# Patient Record
Sex: Female | Born: 1975 | Race: Black or African American | Hispanic: No | Marital: Married | Smoking: Light tobacco smoker
Health system: Southern US, Community
[De-identification: ages and names within clinical notes are randomized; demographics above are authoritative.]

## PROBLEM LIST (undated history)

## (undated) DIAGNOSIS — D62 Acute posthemorrhagic anemia: Secondary | ICD-10-CM

## (undated) DIAGNOSIS — N921 Excessive and frequent menstruation with irregular cycle: Secondary | ICD-10-CM

## (undated) DIAGNOSIS — Z789 Other specified health status: Secondary | ICD-10-CM

---

## 1997-08-05 ENCOUNTER — Encounter: Admission: RE | Admit: 1997-08-05 | Discharge: 1997-08-05 | Payer: Self-pay | Admitting: Family Medicine

## 1998-02-28 ENCOUNTER — Encounter: Admission: RE | Admit: 1998-02-28 | Discharge: 1998-02-28 | Payer: Self-pay | Admitting: Family Medicine

## 1998-03-28 ENCOUNTER — Encounter: Admission: RE | Admit: 1998-03-28 | Discharge: 1998-03-28 | Payer: Self-pay | Admitting: Family Medicine

## 1998-07-13 ENCOUNTER — Encounter: Admission: RE | Admit: 1998-07-13 | Discharge: 1998-07-13 | Payer: Self-pay | Admitting: Family Medicine

## 1999-07-10 ENCOUNTER — Other Ambulatory Visit: Admission: RE | Admit: 1999-07-10 | Discharge: 1999-07-10 | Payer: Self-pay | Admitting: Family Medicine

## 1999-07-10 ENCOUNTER — Encounter: Admission: RE | Admit: 1999-07-10 | Discharge: 1999-07-10 | Payer: Self-pay | Admitting: Family Medicine

## 2000-09-11 ENCOUNTER — Encounter: Admission: RE | Admit: 2000-09-11 | Discharge: 2000-09-11 | Payer: Self-pay | Admitting: Family Medicine

## 2000-09-11 ENCOUNTER — Other Ambulatory Visit: Admission: RE | Admit: 2000-09-11 | Discharge: 2000-09-11 | Payer: Self-pay | Admitting: *Deleted

## 2001-04-07 ENCOUNTER — Encounter: Admission: RE | Admit: 2001-04-07 | Discharge: 2001-04-07 | Payer: Self-pay | Admitting: Family Medicine

## 2001-08-28 ENCOUNTER — Encounter: Admission: RE | Admit: 2001-08-28 | Discharge: 2001-08-28 | Payer: Self-pay | Admitting: Family Medicine

## 2001-10-01 ENCOUNTER — Encounter: Admission: RE | Admit: 2001-10-01 | Discharge: 2001-10-01 | Payer: Self-pay | Admitting: Family Medicine

## 2001-11-28 ENCOUNTER — Encounter: Admission: RE | Admit: 2001-11-28 | Discharge: 2001-11-28 | Payer: Self-pay | Admitting: Family Medicine

## 2002-12-07 ENCOUNTER — Encounter: Admission: RE | Admit: 2002-12-07 | Discharge: 2002-12-07 | Payer: Self-pay | Admitting: Family Medicine

## 2003-03-02 ENCOUNTER — Encounter: Admission: RE | Admit: 2003-03-02 | Discharge: 2003-03-02 | Payer: Self-pay | Admitting: Sports Medicine

## 2003-03-08 ENCOUNTER — Encounter: Admission: RE | Admit: 2003-03-08 | Discharge: 2003-03-08 | Payer: Self-pay | Admitting: Family Medicine

## 2003-04-28 ENCOUNTER — Encounter: Admission: RE | Admit: 2003-04-28 | Discharge: 2003-04-28 | Payer: Self-pay | Admitting: Family Medicine

## 2003-07-09 ENCOUNTER — Encounter: Admission: RE | Admit: 2003-07-09 | Discharge: 2003-07-09 | Payer: Self-pay | Admitting: Family Medicine

## 2004-04-19 ENCOUNTER — Encounter (INDEPENDENT_AMBULATORY_CARE_PROVIDER_SITE_OTHER): Payer: Self-pay | Admitting: *Deleted

## 2004-04-19 LAB — CONVERTED CEMR LAB

## 2004-05-10 ENCOUNTER — Ambulatory Visit: Payer: Self-pay | Admitting: Sports Medicine

## 2004-05-17 ENCOUNTER — Emergency Department (HOSPITAL_COMMUNITY): Admission: EM | Admit: 2004-05-17 | Discharge: 2004-05-17 | Payer: Self-pay | Admitting: *Deleted

## 2006-05-16 DIAGNOSIS — D179 Benign lipomatous neoplasm, unspecified: Secondary | ICD-10-CM | POA: Insufficient documentation

## 2006-05-16 DIAGNOSIS — E6609 Other obesity due to excess calories: Secondary | ICD-10-CM | POA: Insufficient documentation

## 2006-05-16 DIAGNOSIS — L708 Other acne: Secondary | ICD-10-CM | POA: Insufficient documentation

## 2006-05-16 DIAGNOSIS — F17200 Nicotine dependence, unspecified, uncomplicated: Secondary | ICD-10-CM | POA: Insufficient documentation

## 2006-05-16 DIAGNOSIS — E66811 Obesity, class 1: Secondary | ICD-10-CM | POA: Insufficient documentation

## 2006-05-16 DIAGNOSIS — E669 Obesity, unspecified: Secondary | ICD-10-CM | POA: Insufficient documentation

## 2006-05-17 ENCOUNTER — Encounter (INDEPENDENT_AMBULATORY_CARE_PROVIDER_SITE_OTHER): Payer: Self-pay | Admitting: *Deleted

## 2008-12-03 ENCOUNTER — Ambulatory Visit (HOSPITAL_COMMUNITY): Admission: RE | Admit: 2008-12-03 | Discharge: 2008-12-03 | Payer: Self-pay | Admitting: Obstetrics

## 2009-01-14 ENCOUNTER — Emergency Department (HOSPITAL_COMMUNITY): Admission: EM | Admit: 2009-01-14 | Discharge: 2009-01-14 | Payer: Self-pay | Admitting: Emergency Medicine

## 2009-02-04 ENCOUNTER — Encounter: Admission: RE | Admit: 2009-02-04 | Discharge: 2009-02-04 | Payer: Self-pay | Admitting: Internal Medicine

## 2009-07-27 ENCOUNTER — Emergency Department (HOSPITAL_BASED_OUTPATIENT_CLINIC_OR_DEPARTMENT_OTHER): Admission: EM | Admit: 2009-07-27 | Discharge: 2009-07-27 | Payer: Self-pay | Admitting: Emergency Medicine

## 2009-07-27 ENCOUNTER — Ambulatory Visit: Payer: Self-pay | Admitting: Diagnostic Radiology

## 2009-08-08 ENCOUNTER — Encounter: Admission: RE | Admit: 2009-08-08 | Discharge: 2009-08-08 | Payer: Self-pay | Admitting: Internal Medicine

## 2009-12-12 ENCOUNTER — Inpatient Hospital Stay (HOSPITAL_COMMUNITY): Admission: AD | Admit: 2009-12-12 | Discharge: 2009-12-12 | Payer: Self-pay | Admitting: Obstetrics

## 2009-12-12 DIAGNOSIS — N946 Dysmenorrhea, unspecified: Secondary | ICD-10-CM

## 2009-12-12 DIAGNOSIS — D259 Leiomyoma of uterus, unspecified: Secondary | ICD-10-CM

## 2010-01-14 ENCOUNTER — Emergency Department (HOSPITAL_COMMUNITY): Admission: EM | Admit: 2010-01-14 | Discharge: 2010-01-14 | Payer: Self-pay | Admitting: Emergency Medicine

## 2010-05-23 ENCOUNTER — Emergency Department (HOSPITAL_COMMUNITY)
Admission: EM | Admit: 2010-05-23 | Discharge: 2010-05-24 | Disposition: A | Discharge status: Home / Self Care | Payer: 59 | Attending: Emergency Medicine | Admitting: Emergency Medicine

## 2010-05-23 DIAGNOSIS — R21 Rash and other nonspecific skin eruption: Secondary | ICD-10-CM | POA: Insufficient documentation

## 2010-05-23 DIAGNOSIS — J329 Chronic sinusitis, unspecified: Secondary | ICD-10-CM | POA: Insufficient documentation

## 2010-05-24 ENCOUNTER — Other Ambulatory Visit: Payer: Self-pay | Admitting: Family Medicine

## 2010-05-24 DIAGNOSIS — R609 Edema, unspecified: Secondary | ICD-10-CM

## 2010-05-25 ENCOUNTER — Ambulatory Visit
Admission: RE | Admit: 2010-05-25 | Discharge: 2010-05-25 | Disposition: A | Discharge status: Home / Self Care | Payer: Managed Care, Other (non HMO) | Source: Ambulatory Visit | Attending: Family Medicine | Admitting: Family Medicine

## 2010-05-25 ENCOUNTER — Other Ambulatory Visit: Payer: Managed Care, Other (non HMO)

## 2010-05-25 DIAGNOSIS — R609 Edema, unspecified: Secondary | ICD-10-CM

## 2010-05-31 LAB — POCT I-STAT, CHEM 8
BUN: 9 mg/dL (ref 6–23)
Calcium, Ion: 1.2 mmol/L (ref 1.12–1.32)
Chloride: 109 mEq/L (ref 96–112)
Creatinine, Ser: 0.7 mg/dL (ref 0.4–1.2)
Glucose, Bld: 88 mg/dL (ref 70–99)
HCT: 38 % (ref 36.0–46.0)
Hemoglobin: 12.9 g/dL (ref 12.0–15.0)
Potassium: 3.8 mEq/L (ref 3.5–5.1)
Sodium: 141 mEq/L (ref 135–145)
TCO2: 23 mmol/L (ref 0–100)

## 2010-05-31 LAB — PREGNANCY, URINE: Preg Test, Ur: NEGATIVE

## 2010-06-01 LAB — URINALYSIS, ROUTINE W REFLEX MICROSCOPIC
Bilirubin Urine: NEGATIVE
Glucose, UA: NEGATIVE mg/dL
Ketones, ur: NEGATIVE mg/dL
Nitrite: NEGATIVE
Protein, ur: NEGATIVE mg/dL
Specific Gravity, Urine: 1.02 (ref 1.005–1.030)
Urobilinogen, UA: 0.2 mg/dL (ref 0.0–1.0)
pH: 6 (ref 5.0–8.0)

## 2010-06-01 LAB — CBC
HCT: 34 % — ABNORMAL LOW (ref 36.0–46.0)
Hemoglobin: 11.8 g/dL — ABNORMAL LOW (ref 12.0–15.0)
MCH: 35.2 pg — ABNORMAL HIGH (ref 26.0–34.0)
MCHC: 34.6 g/dL (ref 30.0–36.0)
MCV: 101.7 fL — ABNORMAL HIGH (ref 78.0–100.0)
Platelets: 265 10*3/uL (ref 150–400)
RBC: 3.35 MIL/uL — ABNORMAL LOW (ref 3.87–5.11)
RDW: 14.1 % (ref 11.5–15.5)
WBC: 6.6 10*3/uL (ref 4.0–10.5)

## 2010-06-01 LAB — POCT PREGNANCY, URINE: Preg Test, Ur: NEGATIVE

## 2010-06-01 LAB — URINE MICROSCOPIC-ADD ON

## 2010-06-06 LAB — DIFFERENTIAL
Basophils Absolute: 0.1 10*3/uL (ref 0.0–0.1)
Basophils Relative: 1 % (ref 0–1)
Eosinophils Absolute: 0.2 10*3/uL (ref 0.0–0.7)
Eosinophils Relative: 3 % (ref 0–5)
Lymphocytes Relative: 37 % (ref 12–46)
Lymphs Abs: 3.1 10*3/uL (ref 0.7–4.0)
Monocytes Absolute: 0.8 10*3/uL (ref 0.1–1.0)
Monocytes Relative: 9 % (ref 3–12)
Neutro Abs: 4.4 10*3/uL (ref 1.7–7.7)
Neutrophils Relative %: 52 % (ref 43–77)

## 2010-06-06 LAB — COMPREHENSIVE METABOLIC PANEL
ALT: 20 U/L (ref 0–35)
AST: 22 U/L (ref 0–37)
Albumin: 4.2 g/dL (ref 3.5–5.2)
Alkaline Phosphatase: 51 U/L (ref 39–117)
BUN: 10 mg/dL (ref 6–23)
CO2: 21 mEq/L (ref 19–32)
Calcium: 9.7 mg/dL (ref 8.4–10.5)
Chloride: 109 mEq/L (ref 96–112)
Creatinine, Ser: 0.6 mg/dL (ref 0.4–1.2)
GFR calc Af Amer: >60 mL/min (ref >60)
GFR calc non Af Amer: >60 mL/min (ref >60)
Glucose, Bld: 75 mg/dL (ref 70–99)
Potassium: 4.3 mEq/L (ref 3.5–5.1)
Sodium: 143 mEq/L (ref 135–145)
Total Bilirubin: 0.4 mg/dL (ref 0.3–1.2)
Total Protein: 7.7 g/dL (ref 6.0–8.3)

## 2010-06-06 LAB — CBC
HCT: 40.3 % (ref 36.0–46.0)
Hemoglobin: 13.9 g/dL (ref 12.0–15.0)
MCHC: 34.6 g/dL (ref 30.0–36.0)
MCV: 100.1 fL — ABNORMAL HIGH (ref 78.0–100.0)
Platelets: 263 10*3/uL (ref 150–400)
RBC: 4.02 MIL/uL (ref 3.87–5.11)
RDW: 12.6 % (ref 11.5–15.5)
WBC: 8.6 10*3/uL (ref 4.0–10.5)

## 2010-06-06 LAB — D-DIMER, QUANTITATIVE: D-Dimer, Quant: 0.66 ug/mL-FEU — ABNORMAL HIGH (ref 0.00–0.48)

## 2010-06-06 LAB — POCT CARDIAC MARKERS
CKMB, poc: <1 ng/mL — ABNORMAL LOW (ref 1.0–8.0)
Myoglobin, poc: 23.3 ng/mL (ref 12–200)
Troponin i, poc: <0.05 ng/mL (ref 0.00–0.09)

## 2010-06-06 LAB — LIPASE, BLOOD: Lipase: 74 U/L (ref 23–300)

## 2010-06-10 ENCOUNTER — Emergency Department (HOSPITAL_COMMUNITY)
Admission: EM | Admit: 2010-06-10 | Discharge: 2010-06-10 | Disposition: A | Discharge status: Home / Self Care | Payer: 59 | Attending: Emergency Medicine | Admitting: Emergency Medicine

## 2010-06-10 ENCOUNTER — Emergency Department (HOSPITAL_COMMUNITY): Payer: 59

## 2010-06-10 DIAGNOSIS — R072 Precordial pain: Secondary | ICD-10-CM | POA: Insufficient documentation

## 2010-06-10 DIAGNOSIS — R0602 Shortness of breath: Secondary | ICD-10-CM | POA: Insufficient documentation

## 2010-06-10 LAB — CBC
MCV: 98.4 fL (ref 78.0–100.0)
Platelets: 281 10*3/uL (ref 150–400)
RBC: 3.7 MIL/uL — ABNORMAL LOW (ref 3.87–5.11)
RDW: 13.6 % (ref 11.5–15.5)
WBC: 8.5 10*3/uL (ref 4.0–10.5)

## 2010-06-10 LAB — POCT CARDIAC MARKERS
CKMB, poc: <1 ng/mL — ABNORMAL LOW (ref 1.0–8.0)
CKMB, poc: <1 ng/mL — ABNORMAL LOW (ref 1.0–8.0)
Myoglobin, poc: 32.8 ng/mL (ref 12–200)

## 2010-06-10 LAB — POCT I-STAT, CHEM 8
BUN: 5 mg/dL — ABNORMAL LOW (ref 6–23)
Calcium, Ion: 1.18 mmol/L (ref 1.12–1.32)
Chloride: 106 mEq/L (ref 96–112)
Creatinine, Ser: 0.8 mg/dL (ref 0.4–1.2)
Glucose, Bld: 93 mg/dL (ref 70–99)
HCT: 39 % (ref 36.0–46.0)
Hemoglobin: 13.3 g/dL (ref 12.0–15.0)
Potassium: 3.9 mEq/L (ref 3.5–5.1)
Sodium: 140 mEq/L (ref 135–145)
TCO2: 23 mmol/L (ref 0–100)

## 2010-06-10 LAB — DIFFERENTIAL
Basophils Relative: 0 % (ref 0–1)
Eosinophils Absolute: 0.1 10*3/uL (ref 0.0–0.7)
Eosinophils Relative: 2 % (ref 0–5)
Lymphs Abs: 1.9 10*3/uL (ref 0.7–4.0)
Neutrophils Relative %: 69 % (ref 43–77)

## 2010-06-22 LAB — GC/CHLAMYDIA PROBE AMP, GENITAL: Chlamydia, DNA Probe: NEGATIVE

## 2010-06-22 LAB — URINALYSIS, ROUTINE W REFLEX MICROSCOPIC
Glucose, UA: NEGATIVE mg/dL
Protein, ur: 30 mg/dL — AB
Specific Gravity, Urine: 1.027 (ref 1.005–1.030)
Urobilinogen, UA: 1 mg/dL (ref 0.0–1.0)

## 2010-06-22 LAB — URINE MICROSCOPIC-ADD ON

## 2010-06-22 LAB — WET PREP, GENITAL
Clue Cells Wet Prep HPF POC: NONE SEEN
WBC, Wet Prep HPF POC: NONE SEEN

## 2010-06-22 LAB — POCT PREGNANCY, URINE: Preg Test, Ur: NEGATIVE

## 2010-08-31 ENCOUNTER — Other Ambulatory Visit (HOSPITAL_COMMUNITY): Payer: Self-pay | Admitting: Obstetrics and Gynecology

## 2010-08-31 DIAGNOSIS — N979 Female infertility, unspecified: Secondary | ICD-10-CM

## 2010-09-01 ENCOUNTER — Ambulatory Visit (HOSPITAL_COMMUNITY): Payer: 59

## 2010-09-24 ENCOUNTER — Emergency Department (HOSPITAL_COMMUNITY): Payer: 59

## 2010-09-24 ENCOUNTER — Observation Stay (HOSPITAL_COMMUNITY)
Admission: EM | Admit: 2010-09-24 | Discharge: 2010-09-25 | Disposition: A | Discharge status: Home / Self Care | Payer: 59 | Source: Ambulatory Visit | Attending: Emergency Medicine | Admitting: Emergency Medicine

## 2010-09-24 DIAGNOSIS — R079 Chest pain, unspecified: Principal | ICD-10-CM | POA: Insufficient documentation

## 2010-09-24 LAB — POCT I-STAT, CHEM 8
BUN: 4 mg/dL — ABNORMAL LOW (ref 6–23)
Chloride: 104 mEq/L (ref 96–112)
Creatinine, Ser: 0.8 mg/dL (ref 0.50–1.10)
Hemoglobin: 13.3 g/dL (ref 12.0–15.0)
Potassium: 3.8 mEq/L (ref 3.5–5.1)
Sodium: 143 mEq/L (ref 135–145)

## 2010-09-24 LAB — POCT PREGNANCY, URINE: Preg Test, Ur: NEGATIVE

## 2010-09-24 LAB — CK TOTAL AND CKMB (NOT AT ARMC)
CK, MB: 1.1 ng/mL (ref 0.3–4.0)
Relative Index: INVALID (ref 0.0–2.5)

## 2010-09-24 LAB — TROPONIN I
Troponin I: <0.3 ng/mL (ref <0.30)
Troponin I: <0.3 ng/mL (ref <0.30)

## 2010-09-24 LAB — URINALYSIS, ROUTINE W REFLEX MICROSCOPIC
Bilirubin Urine: NEGATIVE
Ketones, ur: NEGATIVE mg/dL
Protein, ur: NEGATIVE mg/dL
Urobilinogen, UA: 0.2 mg/dL (ref 0.0–1.0)

## 2010-09-25 DIAGNOSIS — R072 Precordial pain: Secondary | ICD-10-CM

## 2010-09-25 LAB — TROPONIN I: Troponin I: <0.3 ng/mL (ref <0.30)

## 2011-04-05 ENCOUNTER — Ambulatory Visit (INDEPENDENT_AMBULATORY_CARE_PROVIDER_SITE_OTHER): Payer: 59 | Admitting: Family Medicine

## 2011-04-05 DIAGNOSIS — K219 Gastro-esophageal reflux disease without esophagitis: Secondary | ICD-10-CM

## 2011-05-28 ENCOUNTER — Ambulatory Visit: Payer: 59

## 2011-10-09 ENCOUNTER — Telehealth: Payer: Self-pay

## 2011-10-09 NOTE — Telephone Encounter (Signed)
Pharmacy LM on nurse VM stating that the pt needs a prior auth for her Nexium. [employer] and completed PA on telephone. Pt reported that she has tried/failed both omeprazole and protonix (preferred) in past. PA was approved 09/18/11-10/08/12 case #[id number]. Called [facility] to notify them of approval.

## 2011-10-12 ENCOUNTER — Emergency Department (HOSPITAL_COMMUNITY): Payer: 59

## 2011-10-12 ENCOUNTER — Encounter (HOSPITAL_COMMUNITY): Payer: Self-pay | Admitting: *Deleted

## 2011-10-12 ENCOUNTER — Emergency Department (HOSPITAL_COMMUNITY)
Admission: EM | Admit: 2011-10-12 | Discharge: 2011-10-12 | Disposition: A | Discharge status: Home / Self Care | Payer: 59 | Attending: Emergency Medicine | Admitting: Emergency Medicine

## 2011-10-12 DIAGNOSIS — F17200 Nicotine dependence, unspecified, uncomplicated: Secondary | ICD-10-CM | POA: Insufficient documentation

## 2011-10-12 DIAGNOSIS — R220 Localized swelling, mass and lump, head: Secondary | ICD-10-CM | POA: Insufficient documentation

## 2011-10-12 NOTE — ED Notes (Signed)
Pt reports left side of face swollen and pain to jaw. Saw PCP on Sunday, given prednisone and antibiotic. States face more swollen today.

## 2011-10-12 NOTE — ED Notes (Signed)
Pt states "last Friday night I was eating some fish and started having sharp pains in my jaw, you can see it's swollen, I went to the [facility] but they didn't find anything, they did an amylase test that was negative, my doctor can't see me until next week but I wanted to know what was going on"; pt presents with edema to left lower jaw, no cavities noted to left lower back teeth.

## 2011-10-12 NOTE — ED Provider Notes (Signed)
History     CSN: [id number]  Arrival date & time 10/12/11  1300   First MD Initiated Contact with Patient 10/12/11 1425      Chief Complaint  Patient presents with  . Jaw Pain    (Consider location/radiation/quality/duration/timing/severity/associated sxs/prior treatment) HPI Comments: Patient with L cheek swelling x 1 week. She was eating fish at time of onset. No prior known food allergies. No pain. She [facility] when symptoms persisted. Was prescribed prednisone and keflex, had blood test checked which were negative. She has not been improving with treatment. Onset acute. Course is constant, not changing. Ice applied at home without relief. Nothing makes symptoms better or worse. No SOB, trouble breathing. No new medications.   The history is provided by the patient.    History reviewed. No pertinent past medical history.  History reviewed. No pertinent past surgical history.  No family history on file.  History  Substance Use Topics  . Smoking status: Current Some Day Smoker  . Smokeless tobacco: Not on file  . Alcohol Use: No    OB History    Grav Para Term Preterm Abortions TAB SAB Ect Mult Living                  Review of Systems  Constitutional: Negative for fever.  HENT: Positive for facial swelling. Negative for trouble swallowing and dental problem.   Eyes: Negative for redness.  Respiratory: Negative for shortness of breath, wheezing and stridor.   Cardiovascular: Negative for chest pain.  Gastrointestinal: Negative for nausea and vomiting.  Musculoskeletal: Negative for myalgias.  Skin: Negative for rash.  Neurological: Negative for light-headedness.  Psychiatric/Behavioral: Negative for confusion.    Allergies  Penicillins  Home Medications   Current Outpatient Rx  Name Route Sig Dispense Refill  . CEPHALEXIN 500 MG PO CAPS Oral Take 500 mg by mouth 2 (two) times daily.    . ADULT MULTIVITAMIN W/MINERALS CH Oral Take 1 tablet by mouth  daily.    Marland Kitchen PREDNISONE 20 MG PO TABS Oral Take 10-40 mg by mouth See admin instructions. Take 2 tablets for 2 days, 1 tablet for 2 days, half tablet for 2 days      BP 135/82  Pulse 88  Temp 98.8 F (37.1 C) (Oral)  Resp 18  SpO2 100%  Physical Exam  Nursing note and vitals reviewed. Constitutional: She appears well-developed and well-nourished.  HENT:  Head: Normocephalic and atraumatic.  Right Ear: External ear normal.  Left Ear: External ear normal.  Nose: Nose normal.       Subtle swelling to L cheek without abscess, erythema, or warmth. Full ROM in jaw. Area is not tender to palp. No dental infections seen.   Eyes: Conjunctivae are normal.  Neck: Normal range of motion. Neck supple.  Pulmonary/Chest: No respiratory distress.  Lymphadenopathy:    She has no cervical adenopathy.  Neurological: She is alert.  Skin: Skin is warm and dry.  Psychiatric: She has a normal mood and affect.    ED Course  Procedures (including critical care time)  Labs Reviewed - No data to display Dg Orthopantogram  10/12/2011  *RADIOLOGY REPORT*  Clinical Data: Left side mandible swelling for 1 week.  ORTHOPANTOGRAM/PANORAMIC  Comparison: None.  Findings: No bony destructive change or fracture is identified.  No periapical lucency is seen.  Mandibular condyles are located.  IMPRESSION: No acute finding.  Original Report Authenticated By: Bernadene Bell. D'ALESSIO, M.D.     1. Left facial  swelling     2:58 PM Patient seen and examined. Will check Pan[employer].   Vital signs reviewed and are as follows: Filed Vitals:   10/12/11 1304  BP: 135/82  Pulse: 88  Temp: 98.8 F (37.1 C)  Resp: 18   X-ray neg. Discussed case with Dr. Juleen China. No further testing/treatment warranted. Patient informed of findings. She was urged to watch area, complete medications. If worsening -- return to ED. Otherwise follow-up with PCP which she plans to do in 1 week. She verbalizes understanding and agrees with plan.     MDM  Mild facial swelling. No infection seen. No sialolith suspected. Not responding to prednisone. PCP follow-up indicated. No tongue, neck, or throat involvement.         [place], [place] 10/13/11 [phone]

## 2011-10-13 NOTE — ED Provider Notes (Signed)
.    Donnetta Hutching, MD 10/14/11 [phone]

## 2012-04-23 ENCOUNTER — Telehealth: Payer: Self-pay | Admitting: *Deleted

## 2012-04-23 MED ORDER — ESOMEPRAZOLE MAGNESIUM 40 MG PO CPDR: 40.0000 mg | DELAYED_RELEASE_CAPSULE | Freq: Every day | ORAL | Status: DC

## 2012-04-23 NOTE — Telephone Encounter (Signed)
Refill done. Needs OV.

## 2012-04-23 NOTE — Telephone Encounter (Signed)
[facility] requesting refill on nexium 40mg .  Last fill on 03/20/12.  Chart is at nurses station for review [place] [id number]

## 2012-04-23 NOTE — Telephone Encounter (Signed)
Pt notified that rx was sent in and that she needs an ov

## 2012-04-25 ENCOUNTER — Emergency Department (HOSPITAL_COMMUNITY)
Admission: EM | Admit: 2012-04-25 | Discharge: 2012-04-25 | Disposition: A | Discharge status: Home / Self Care | Payer: No Typology Code available for payment source | Attending: Emergency Medicine | Admitting: Emergency Medicine

## 2012-04-25 DIAGNOSIS — S298XXA Other specified injuries of thorax, initial encounter: Secondary | ICD-10-CM | POA: Insufficient documentation

## 2012-04-25 DIAGNOSIS — IMO0002 Reserved for concepts with insufficient information to code with codable children: Secondary | ICD-10-CM | POA: Insufficient documentation

## 2012-04-25 DIAGNOSIS — F17200 Nicotine dependence, unspecified, uncomplicated: Secondary | ICD-10-CM | POA: Insufficient documentation

## 2012-04-25 DIAGNOSIS — Y92410 Unspecified street and highway as the place of occurrence of the external cause: Secondary | ICD-10-CM | POA: Insufficient documentation

## 2012-04-25 DIAGNOSIS — S139XXA Sprain of joints and ligaments of unspecified parts of neck, initial encounter: Secondary | ICD-10-CM | POA: Insufficient documentation

## 2012-04-25 DIAGNOSIS — Y9389 Activity, other specified: Secondary | ICD-10-CM | POA: Insufficient documentation

## 2012-04-25 MED ORDER — IBUPROFEN 800 MG PO TABS: 800.0000 mg | ORAL_TABLET | Freq: Three times a day (TID) | ORAL | Status: DC

## 2012-04-25 MED ORDER — OXYCODONE-ACETAMINOPHEN 5-325 MG PO TABS
2.0000 | ORAL_TABLET | Freq: Once | ORAL | Status: AC
  Administered 2012-04-25: 2 via ORAL
  Filled 2012-04-25: qty 2

## 2012-04-25 MED ORDER — CYCLOBENZAPRINE HCL 10 MG PO TABS
10.0000 mg | ORAL_TABLET | Freq: Once | ORAL | Status: AC
  Administered 2012-04-25: 10 mg via ORAL
  Filled 2012-04-25: qty 1

## 2012-04-25 MED ORDER — HYDROCODONE-ACETAMINOPHEN 5-325 MG PO TABS: 1.0000 | ORAL_TABLET | Freq: Four times a day (QID) | ORAL | Status: DC | PRN

## 2012-04-25 MED ORDER — IBUPROFEN 800 MG PO TABS
800.0000 mg | ORAL_TABLET | Freq: Once | ORAL | Status: AC
  Administered 2012-04-25: 800 mg via ORAL
  Filled 2012-04-25: qty 1

## 2012-04-25 MED ORDER — CYCLOBENZAPRINE HCL 10 MG PO TABS: 10.0000 mg | ORAL_TABLET | Freq: Two times a day (BID) | ORAL | Status: DC | PRN
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
## 2012-04-25 NOTE — ED Provider Notes (Signed)
Medical screening examination/treatment/procedure(s) were performed by non-physician practitioner and as supervising physician I was immediately available for consultation/collaboration.   Celene Kras, MD 04/25/12 2040
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
## 2012-06-09 ENCOUNTER — Ambulatory Visit: Payer: No Typology Code available for payment source

## 2012-06-11 ENCOUNTER — Ambulatory Visit: Payer: No Typology Code available for payment source | Admitting: Physical Therapy

## 2012-06-19 ENCOUNTER — Ambulatory Visit: Payer: Managed Care, Other (non HMO) | Attending: Internal Medicine | Admitting: Physical Therapy

## 2012-06-19 DIAGNOSIS — M545 Low back pain, unspecified: Secondary | ICD-10-CM | POA: Insufficient documentation

## 2012-06-19 DIAGNOSIS — IMO0001 Reserved for inherently not codable concepts without codable children: Secondary | ICD-10-CM | POA: Insufficient documentation

## 2012-06-19 DIAGNOSIS — M542 Cervicalgia: Secondary | ICD-10-CM | POA: Insufficient documentation

## 2012-06-20 ENCOUNTER — Ambulatory Visit: Payer: Managed Care, Other (non HMO) | Admitting: Physical Therapy

## 2012-06-24 ENCOUNTER — Ambulatory Visit: Payer: Managed Care, Other (non HMO)

## 2012-06-26 ENCOUNTER — Ambulatory Visit: Payer: Managed Care, Other (non HMO) | Admitting: Physical Therapy

## 2012-07-01 ENCOUNTER — Ambulatory Visit: Payer: Managed Care, Other (non HMO) | Admitting: Physical Therapy

## 2012-07-03 ENCOUNTER — Ambulatory Visit: Payer: Managed Care, Other (non HMO) | Admitting: Physical Therapy

## 2012-07-08 ENCOUNTER — Ambulatory Visit: Payer: Managed Care, Other (non HMO) | Admitting: Physical Therapy

## 2012-07-10 ENCOUNTER — Ambulatory Visit: Payer: Managed Care, Other (non HMO) | Admitting: Physical Therapy

## 2012-07-15 ENCOUNTER — Ambulatory Visit: Payer: Managed Care, Other (non HMO) | Admitting: Physical Therapy

## 2012-07-17 ENCOUNTER — Ambulatory Visit: Payer: Managed Care, Other (non HMO) | Attending: Internal Medicine | Admitting: Physical Therapy

## 2012-07-17 DIAGNOSIS — IMO0001 Reserved for inherently not codable concepts without codable children: Secondary | ICD-10-CM | POA: Insufficient documentation

## 2012-07-17 DIAGNOSIS — M545 Low back pain, unspecified: Secondary | ICD-10-CM | POA: Insufficient documentation

## 2012-07-17 DIAGNOSIS — M542 Cervicalgia: Secondary | ICD-10-CM | POA: Insufficient documentation

## 2012-07-22 ENCOUNTER — Ambulatory Visit: Payer: Managed Care, Other (non HMO) | Admitting: Physical Therapy

## 2012-07-24 ENCOUNTER — Ambulatory Visit: Payer: Managed Care, Other (non HMO) | Admitting: Physical Therapy

## 2012-07-29 ENCOUNTER — Ambulatory Visit: Payer: Managed Care, Other (non HMO) | Admitting: Physical Therapy

## 2012-07-31 ENCOUNTER — Ambulatory Visit: Payer: Managed Care, Other (non HMO) | Admitting: Physical Therapy

## 2012-08-05 ENCOUNTER — Ambulatory Visit: Payer: Managed Care, Other (non HMO) | Admitting: Physical Therapy

## 2012-08-07 ENCOUNTER — Ambulatory Visit: Payer: Managed Care, Other (non HMO) | Admitting: Physical Therapy

## 2012-08-13 ENCOUNTER — Encounter: Payer: Managed Care, Other (non HMO) | Admitting: Physical Therapy

## 2012-08-13 ENCOUNTER — Ambulatory Visit: Payer: Managed Care, Other (non HMO) | Admitting: Physical Therapy

## 2012-08-14 ENCOUNTER — Ambulatory Visit: Payer: Managed Care, Other (non HMO) | Admitting: Physical Therapy

## 2012-08-15 ENCOUNTER — Encounter: Payer: Managed Care, Other (non HMO) | Admitting: Physical Therapy

## 2012-08-19 ENCOUNTER — Ambulatory Visit: Payer: Managed Care, Other (non HMO) | Attending: Internal Medicine | Admitting: Physical Therapy

## 2012-08-19 DIAGNOSIS — M542 Cervicalgia: Secondary | ICD-10-CM | POA: Insufficient documentation

## 2012-08-19 DIAGNOSIS — M545 Low back pain, unspecified: Secondary | ICD-10-CM | POA: Insufficient documentation

## 2012-08-19 DIAGNOSIS — IMO0001 Reserved for inherently not codable concepts without codable children: Secondary | ICD-10-CM | POA: Insufficient documentation

## 2013-01-03 ENCOUNTER — Observation Stay (HOSPITAL_COMMUNITY)
Admission: AD | Admit: 2013-01-03 | Discharge: 2013-01-04 | Disposition: A | Discharge status: Home / Self Care | Payer: Managed Care, Other (non HMO) | Source: Ambulatory Visit | Attending: Obstetrics and Gynecology | Admitting: Obstetrics and Gynecology

## 2013-01-03 ENCOUNTER — Encounter (HOSPITAL_COMMUNITY): Payer: Self-pay | Admitting: Family

## 2013-01-03 DIAGNOSIS — N921 Excessive and frequent menstruation with irregular cycle: Secondary | ICD-10-CM

## 2013-01-03 DIAGNOSIS — D62 Acute posthemorrhagic anemia: Secondary | ICD-10-CM | POA: Diagnosis present

## 2013-01-03 DIAGNOSIS — N920 Excessive and frequent menstruation with regular cycle: Principal | ICD-10-CM | POA: Insufficient documentation

## 2013-01-03 HISTORY — DX: Other specified health status: Z78.9

## 2013-01-03 HISTORY — DX: Acute posthemorrhagic anemia: D62

## 2013-01-03 HISTORY — DX: Excessive and frequent menstruation with irregular cycle: N92.1

## 2013-01-03 LAB — URINALYSIS, ROUTINE W REFLEX MICROSCOPIC
Nitrite: NEGATIVE
Specific Gravity, Urine: 1.01 (ref 1.005–1.030)
Urobilinogen, UA: 0.2 mg/dL (ref 0.0–1.0)
pH: 7.5 (ref 5.0–8.0)

## 2013-01-03 LAB — CBC
MCH: 34.2 pg — ABNORMAL HIGH (ref 26.0–34.0)
MCHC: 35.1 g/dL (ref 30.0–36.0)
Platelets: 235 10*3/uL (ref 150–400)
RBC: 1.9 MIL/uL — ABNORMAL LOW (ref 3.87–5.11)

## 2013-01-03 LAB — URINE MICROSCOPIC-ADD ON

## 2013-01-03 LAB — POCT PREGNANCY, URINE: Preg Test, Ur: NEGATIVE

## 2013-01-03 MED ORDER — DIPHENHYDRAMINE HCL 25 MG PO CAPS
25.0000 mg | ORAL_CAPSULE | Freq: Once | ORAL | Status: AC
  Administered 2013-01-03: 25 mg via ORAL
  Filled 2013-01-03: qty 1

## 2013-01-03 MED ORDER — ESTROGENS CONJUGATED 25 MG IJ SOLR
25.0000 mg | Freq: Four times a day (QID) | INTRAMUSCULAR | Status: DC | PRN
  Administered 2013-01-03 – 2013-01-04 (×3): 25 mg via INTRAVENOUS
  Filled 2013-01-03 (×3): qty 25

## 2013-01-03 MED ORDER — SODIUM CHLORIDE 0.9 % IV SOLN
500.0000 mL | Freq: Once | INTRAVENOUS | Status: AC
  Administered 2013-01-03: 500 mL via INTRAVENOUS

## 2013-01-03 MED ORDER — SODIUM CHLORIDE 0.9 % IV SOLN
INTRAVENOUS | Status: DC
  Administered 2013-01-03: 500 mL via INTRAVENOUS

## 2013-01-03 MED ORDER — IBUPROFEN 600 MG PO TABS
600.0000 mg | ORAL_TABLET | Freq: Four times a day (QID) | ORAL | Status: DC | PRN
  Administered 2013-01-03 – 2013-01-04 (×2): 600 mg via ORAL
  Filled 2013-01-03 (×2): qty 1

## 2013-01-03 MED ORDER — SODIUM CHLORIDE 0.9 % IV SOLN: INTRAVENOUS | Status: DC

## 2013-01-03 MED ORDER — ACETAMINOPHEN 325 MG PO TABS
650.0000 mg | ORAL_TABLET | Freq: Once | ORAL | Status: AC
  Administered 2013-01-03: 650 mg via ORAL
  Filled 2013-01-03: qty 2

## 2013-01-03 NOTE — MAU Note (Addendum)
37 yo, G3P1, presents to MAU with c/o vaginal bleeding. Reports LMP 12/23/12 with normal bleeding until 10/10. Reports she then began bleeding and passing clots on 10/15 and continues to bleed today. Reports large clots; saturating super pad within an hour since Wednesday. Reports HA, dizziness with standing, and feeling sleepy. Denies pain. Denies use of any medications. No contraception method at this time. Stopped smoking 1 week ago.

## 2013-01-03 NOTE — MAU Provider Note (Signed)
History     CSN: [id number]  Arrival date and time: 01/03/13 1202 Provider notified @: 1241 Provider on unit: 1335 Provider at bedside: 1340     Chief Complaint  Patient presents with  . Vaginal Bleeding   HPI  Shelby Case is 37 y.o. G3P1 female presenting with bright red, heavy vaginal bleeding.  She reports her LMP  Was 12/23/2012 and stopped 4-5 days later.  She reports that her menstrual cycle was a normal length and flow.  She started bleeding  Heavily on Wednesday 12/31/2012.  She is symptomatic today with c/o extreme fatigue and light-headedness.  She denies any significant medical history.  Past Medical History  Diagnosis Date  . Medical history non-contributory     History reviewed. No pertinent past surgical history.  History reviewed. No pertinent family history.  History  Substance Use Topics  . Smoking status: Former Smoker    Quit date: 12/27/2012  . Smokeless tobacco: Not on file  . Alcohol Use: No    Allergies:  Allergies  Allergen Reactions  . Penicillins Other (See Comments)    Childhood reaction     Prescriptions prior to admission  Medication Sig Dispense Refill  . cyclobenzaprine (FLEXERIL) 10 MG tablet Take 1 tablet (10 mg total) by mouth 2 (two) times daily as needed for muscle spasms.  20 tablet  0  . esomeprazole (NEXIUM) 40 MG capsule Take 1 capsule (40 mg total) by mouth daily.  30 capsule  0  . HYDROcodone-acetaminophen (NORCO/VICODIN) 5-325 MG per tablet Take 1-2 tablets by mouth every 6 (six) hours as needed for pain.  6 tablet  0  . ibuprofen (ADVIL,MOTRIN) 200 MG tablet Take 800 mg by mouth every 6 (six) hours as needed. cramps      . ibuprofen (ADVIL,MOTRIN) 800 MG tablet Take 1 tablet (800 mg total) by mouth 3 (three) times daily.  21 tablet  0  . Multiple Vitamin (MULTIVITAMIN WITH MINERALS) TABS Take 1 tablet by mouth daily.        Review of Systems  Constitutional: Positive for malaise/fatigue.  Eyes: Negative.    Respiratory: Negative.   Cardiovascular: Negative.   Gastrointestinal: Negative.   Genitourinary: Negative.   Musculoskeletal: Negative.   Skin: Negative.   Neurological: Positive for dizziness and headaches.  Endo/Heme/Allergies: Negative.   Psychiatric/Behavioral: Negative.    Physical Exam   Blood pressure 125/68, pulse 101, temperature 98.7 F (37.1 C), temperature source Oral, resp. rate 16. Results for orders placed during the hospital encounter of 01/03/13 (from the past 24 hour(s))  URINALYSIS, ROUTINE W REFLEX MICROSCOPIC     Status: Abnormal   Collection Time    01/03/13 12:15 PM      Result Value Range   Color, Urine AMBER (*) YELLOW   APPearance CLOUDY (*) CLEAR   Specific Gravity, Urine 1.010  1.005 - 1.030   pH 7.5  5.0 - 8.0   Glucose, UA NEGATIVE  NEGATIVE mg/dL   Hgb urine dipstick LARGE (*) NEGATIVE   Bilirubin Urine NEGATIVE  NEGATIVE   Ketones, ur NEGATIVE  NEGATIVE mg/dL   Protein, ur 30 (*) NEGATIVE mg/dL   Urobilinogen, UA 0.2  0.0 - 1.0 mg/dL   Nitrite NEGATIVE  NEGATIVE   Leukocytes, UA NEGATIVE  NEGATIVE  URINE MICROSCOPIC-ADD ON     Status: None   Collection Time    01/03/13 12:15 PM      Result Value Range   WBC, UA 0-2  <3 WBC/hpf   RBC /  HPF TOO NUMEROUS TO COUNT  <3 RBC/hpf  POCT PREGNANCY, URINE     Status: None   Collection Time    01/03/13 12:19 PM      Result Value Range   Preg Test, Ur NEGATIVE  NEGATIVE  CBC     Status: Abnormal   Collection Time    01/03/13 12:59 PM      Result Value Range   WBC 10.9 (*) 4.0 - 10.5 K/uL   RBC 1.90 (*) 3.87 - 5.11 MIL/uL   Hemoglobin 6.5 (*) 12.0 - 15.0 g/dL   HCT 86.5 (*) 78.4 - 69.6 %   MCV 97.4  78.0 - 100.0 fL   MCH 34.2 (*) 26.0 - 34.0 pg   MCHC 35.1  30.0 - 36.0 g/dL   RDW 29.5  28.4 - 13.2 %   Platelets 235  150 - 400 K/uL   Physical Exam  Constitutional: She is oriented to person, place, and time. She appears well-developed and well-nourished.  HENT:  Head: Normocephalic.   Eyes: Pupils are equal, round, and reactive to light.  Neck: Normal range of motion. Neck supple.  Cardiovascular: Normal rate, regular rhythm and normal heart sounds.   Respiratory: Effort normal and breath sounds normal.  GI: Soft. Bowel sounds are normal.  Genitourinary: Vagina normal and uterus normal.  Moderate bright red bleeding  Musculoskeletal: Normal range of motion.  Neurological: She is alert and oriented to person, place, and time. She has normal reflexes.  Skin: Skin is warm and dry.  Psychiatric: She has a normal mood and affect. Her behavior is normal. Judgment and thought content normal.  SSE: moderate bright, red bleeding, no brisk bleeding noted from cervix VE: no adnexal masses, uterus non-tender GC/CT - pending  MAU Course  Procedures UPT CBC Normal Saline IV 500 mL bolus then 125 mL/hr SSE GC/CT   Assessment and Plan  37 y.o female with Metrorrhagia ABL anemia   Place patient in outpatient observation on Women's Unit Infuse Normal Saline at rate of 75 mL/hr Transfuse PRBC x 3 units Premedicate with Tylenol 650 mg and Benadryl 25 mg Premarin 25 mg IV every 6 hours until bleeding stops, 4 doses max  *Consult with Dr. Cherly Hensen - agrees with plan  Kenard Gower, MSN, CNM 01/03/2013, 2:01 PM

## 2013-01-03 NOTE — H&P (Signed)
Chief Complaint   Patient presents with   .  Vaginal Bleeding    HPI  [name] is 36 y.o. G3P1 female presenting with bright red, heavy vaginal bleeding. She reports her LMP  Was 12/23/2012 and stopped 4-5 days later. She reports that her menstrual cycle was a normal length and flow. She started bleeding  Heavily on Wednesday 12/31/2012. She is symptomatic today with c/o extreme fatigue and light-headedness. She denies any significant medical history.   Past Medical History   Diagnosis  Date   .  Medical history non-contributory     History reviewed. No pertinent past surgical history.  History reviewed. No pertinent family history.  History   Substance Use Topics   .  Smoking status:  Former Smoker     Quit date:  12/27/2012   .  Smokeless tobacco:  Not on file   .  Alcohol Use:  No    Allergies:  Allergies   Allergen  Reactions   .  Penicillins  Other (See Comments)     Childhood reaction    Prescriptions prior to admission   Medication  Sig  Dispense  Refill   .  cyclobenzaprine (FLEXERIL) 10 MG tablet  Take 1 tablet (10 mg total) by mouth 2 (two) times daily as needed for muscle spasms.  20 tablet  0   .  esomeprazole (NEXIUM) 40 MG capsule  Take 1 capsule (40 mg total) by mouth daily.  30 capsule  0   .  HYDROcodone-acetaminophen (NORCO/VICODIN) 5-325 MG per tablet  Take 1-2 tablets by mouth every 6 (six) hours as needed for pain.  6 tablet  0   .  ibuprofen (ADVIL,MOTRIN) 200 MG tablet  Take 800 mg by mouth every 6 (six) hours as needed. cramps     .  ibuprofen (ADVIL,MOTRIN) 800 MG tablet  Take 1 tablet (800 mg total) by mouth 3 (three) times daily.  21 tablet  0   .  Multiple Vitamin (MULTIVITAMIN WITH MINERALS) TABS  Take 1 tablet by mouth daily.      Review of Systems  Constitutional: Positive for malaise/fatigue.  Eyes: Negative.  Respiratory: Negative.  Cardiovascular: Negative.  Gastrointestinal: Negative.  Genitourinary: Negative.   Musculoskeletal: Negative.  Skin: Negative.  Neurological: Positive for dizziness and headaches.  Endo/Heme/Allergies: Negative.  Psychiatric/Behavioral: Negative.   Physical Exam   Blood pressure 125/68, pulse 101, temperature 98.7 F (37.1 C), temperature source Oral, resp. rate 16.  Results for orders placed during the hospital encounter of 01/03/13 (from the past 24 hour(s))   URINALYSIS, ROUTINE W REFLEX MICROSCOPIC Status: Abnormal    Collection Time    01/03/13 12:15 PM   Result  Value  Range    Color, Urine  AMBER (*)  YELLOW    APPearance  CLOUDY (*)  CLEAR    Specific Gravity, Urine  1.010  1.005 - 1.030    pH  7.5  5.0 - 8.0    Glucose, UA  NEGATIVE  NEGATIVE mg/dL    Hgb urine dipstick  LARGE (*)  NEGATIVE    Bilirubin Urine  NEGATIVE  NEGATIVE    Ketones, ur  NEGATIVE  NEGATIVE mg/dL    Protein, ur  30 (*)  NEGATIVE mg/dL    Urobilinogen, UA  0.2  0.0 - 1.0 mg/dL    Nitrite  NEGATIVE  NEGATIVE    Leukocytes, UA  NEGATIVE  NEGATIVE   URINE MICROSCOPIC-ADD ON Status: None    Collection Time  01/03/13 12:15 PM   Result  Value  Range    WBC, UA  0-2  <3 WBC/hpf    RBC / HPF  TOO NUMEROUS TO COUNT  <3 RBC/hpf   POCT PREGNANCY, URINE Status: None    Collection Time    01/03/13 12:19 PM   Result  Value  Range    Preg Test, Ur  NEGATIVE  NEGATIVE   CBC Status: Abnormal    Collection Time    01/03/13 12:59 PM   Result  Value  Range    WBC  10.9 (*)  4.0 - 10.5 K/uL    RBC  1.90 (*)  3.87 - 5.11 MIL/uL    Hemoglobin  6.5 (*)  12.0 - 15.0 g/dL    HCT  78.2 (*)  95.6 - 46.0 %    MCV  97.4  78.0 - 100.0 fL    MCH  34.2 (*)  26.0 - 34.0 pg    MCHC  35.1  30.0 - 36.0 g/dL    RDW  21.3  08.6 - 57.8 %    Platelets  235  150 - 400 K/uL    Physical Exam  Constitutional: She is oriented to person, place, and time. She appears well-developed and well-nourished.  HENT:  Head: Normocephalic.  Eyes: Pupils are equal, round, and reactive to light.  Neck: Normal range  of motion. Neck supple.  Cardiovascular: Normal rate, regular rhythm and normal heart sounds.  Respiratory: Effort normal and breath sounds normal.  GI: Soft. Bowel sounds are normal.  Genitourinary: Vagina normal and uterus normal.  Moderate bright red bleeding  Musculoskeletal: Normal range of motion.  Neurological: She is alert and oriented to person, place, and time. She has normal reflexes.  Skin: Skin is warm and dry.  Psychiatric: She has a normal mood and affect. Her behavior is normal. Judgment and thought content normal.  SSE: moderate bright, red bleeding, no brisk bleeding noted from cervix  VE: no adnexal masses, uterus non-tender  GC/CT - pending   Assessment and Plan   37 y.o female with Metrorrhagia  ABL anemia   Place patient in outpatient observation on Women's Unit  Infuse Normal Saline at rate of 75 mL/hr  Transfuse PRBC x 3 units  Premedicate with Tylenol 650 mg and Benadryl 25 mg  Premarin 25 mg IV every 6 hours until bleeding stops, 4 doses max   *Consult with Dr. Cherly Hensen - agrees with plan Raelyn Mora, Judie Petit., MSN, CNM 01/03/2013, 2:59 PM

## 2013-01-03 NOTE — Progress Notes (Signed)
Pt tearful and anxious 

## 2013-01-03 NOTE — MAU Provider Note (Signed)
Will do sono as outpt

## 2013-01-04 LAB — TYPE AND SCREEN
Unit division: 0
Unit division: 0

## 2013-01-04 LAB — CBC
MCH: 32.1 pg (ref 26.0–34.0)
MCV: 90.3 fL (ref 78.0–100.0)
Platelets: 211 10*3/uL (ref 150–400)
RBC: 2.68 MIL/uL — ABNORMAL LOW (ref 3.87–5.11)
RDW: 17.3 % — ABNORMAL HIGH (ref 11.5–15.5)

## 2013-01-04 MED ORDER — ONDANSETRON 8 MG/NS 50 ML IVPB
8.0000 mg | Freq: Once | INTRAVENOUS | Status: AC
  Administered 2013-01-04: 8 mg via INTRAVENOUS
  Filled 2013-01-04: qty 8

## 2013-01-04 MED ORDER — IRON POLYSACCH CMPLX-B12-FA 150-0.025-1 MG PO CAPS: 1.0000 | ORAL_CAPSULE | Freq: Two times a day (BID) | ORAL | Status: DC

## 2013-01-04 MED ORDER — PROMETHAZINE HCL 25 MG RE SUPP: 25.0000 mg | Freq: Four times a day (QID) | RECTAL | Status: DC | PRN

## 2013-01-04 MED ORDER — MEDROXYPROGESTERONE ACETATE 10 MG PO TABS: 10.0000 mg | ORAL_TABLET | Freq: Two times a day (BID) | ORAL | Status: DC

## 2013-01-04 MED ORDER — MEDROXYPROGESTERONE ACETATE 10 MG PO TABS
10.0000 mg | ORAL_TABLET | Freq: Two times a day (BID) | ORAL | Status: DC
  Administered 2013-01-04: 10 mg via ORAL
  Filled 2013-01-04 (×3): qty 1

## 2013-01-04 NOTE — Discharge Summary (Signed)
Physician Discharge Summary  Patient ID: Shelby Case MRN: [id number] DOB/AGE: 1975-07-21 37 y.o.  Admit date: 01/03/2013 Discharge date: 01/04/2013  Admission Diagnoses: metrorrhagia, symptomatic anemia  Discharge Diagnoses: metrorrhagia, symptomatic anmeia Principal Problem:   Metrorrhagia Active Problems:   Acute blood loss anemia   Discharged Condition: stable  Hospital Course: pt admitted for IV premarin and blood transfusion due to symptomatic anemia. Pt was given 3 units PRBC and IV premarin x 3. Sono deferred until bleeding stops  Consults: None  Significant Diagnostic Studies: labs:  CBC    Component Value Date/Time   WBC 15.9* 01/04/2013 0435   RBC 2.68* 01/04/2013 0435   HGB 8.6* 01/04/2013 0435   HCT 24.2* 01/04/2013 0435   PLT 211 01/04/2013 0435   MCV 90.3 01/04/2013 0435   MCH 32.1 01/04/2013 0435   MCHC 35.5 01/04/2013 0435   RDW 17.3* 01/04/2013 0435   LYMPHSABS 1.9 06/10/2010 2040   MONOABS 0.7 06/10/2010 2040   EOSABS 0.1 06/10/2010 2040   BASOSABS 0.0 06/10/2010 2040      Treatments: blood transfusion  Discharge Exam: Blood pressure 114/63, pulse 96, temperature 98.3 F (36.8 C), temperature source Oral, resp. rate 18, SpO2 100.00%. General appearance: alert, cooperative and no distress Resp: clear to auscultation bilaterally Cardio: regular rate and rhythm, S1, S2 normal, no murmur, click, rub or gallop GI: soft, non-tender; bowel sounds normal; no masses,  no organomegaly Pelvic: deferred Extremities: no edema, redness or tenderness in the calves or thighs  Disposition: 01-Home or Self Care  Discharge Orders   Future Orders Complete By Expires   Discharge patient  As directed        Medication List         cyclobenzaprine 10 MG tablet  Commonly known as:  FLEXERIL  Take 1 tablet (10 mg total) by mouth 2 (two) times daily as needed for muscle spasms.     ibuprofen 800 MG tablet  Commonly known as:  ADVIL,MOTRIN  Take 1  tablet (800 mg total) by mouth 3 (three) times daily.     Iron Polysacch Cmplx-B12-FA 150-0.025-1 MG Caps  Take 1 capsule by mouth 2 (two) times daily.     medroxyPROGESTERone 10 MG tablet  Commonly known as:  PROVERA  Take 1 tablet (10 mg total) by mouth 2 (two) times daily.     promethazine 25 MG suppository  Commonly known as:  PHENERGAN  Place 1 suppository (25 mg total) rectally every 6 (six) hours as needed for nausea.     vitamin C 500 MG tablet  Commonly known as:  ASCORBIC ACID  Take 500 mg by mouth daily.           Follow-up Information   Follow up with Marquinn Meschke A, MD. (call offfice when bleeding stops for ultrasound)    Specialty:  Obstetrics and Gynecology   Contact information:   [address] [place] [address] [phone]       Signed: Doylene Splinter A 01/04/2013, 9:16 AM

## 2013-01-04 NOTE — Progress Notes (Signed)
S: (+) nausea. Vomited x 1 Reports 50 cents size clots or more with cycle however only use 2 pads/d No longer feeling palpitation or lightheadedness Ate breakfast  O:  S/p PRBC VS BP Lungs: clear to A Cor RRR Abd: soft obese nontender  Pelvic: pad( scant blood) Extr> no edema  CBC    Component Value Date/Time   WBC 15.9* 01/04/2013 0435   RBC 2.68* 01/04/2013 0435   HGB 8.6* 01/04/2013 0435   HCT 24.2* 01/04/2013 0435   PLT 211 01/04/2013 0435   MCV 90.3 01/04/2013 0435   MCH 32.1 01/04/2013 0435   MCHC 35.5 01/04/2013 0435   RDW 17.3* 01/04/2013 0435   LYMPHSABS 1.9 06/10/2010 2040   MONOABS 0.7 06/10/2010 2040   EOSABS 0.1 06/10/2010 2040   BASOSABS 0.0 06/10/2010 2040       IIMP: metrorrhagia w/ associated anemia due to chronic and acute blood loss P) d/c premarin.  I believe nausea related to med Provera 10mg  po bid Restart iron po bid sched appt at office when bleeding stops for sonogram D/c home

## 2013-01-04 NOTE — Progress Notes (Signed)
Discharge instructions provided to patient and family at bedside.  Follow up appointments, medications, when to call the doctor and community resources discussed.  No questions at this time.  Take home educational materials provided regarding anemia and iron rich foods.  Discussed importance of taking iron as directed.  Patient left unit in stable condition with all personal belongings.  Prescriptions send directly to pharmacy.  Patient left unit in stable condition with all personal belongings accompanied by staff.  Osvaldo Angst, RN--------

## 2013-01-04 NOTE — Progress Notes (Signed)
Vomitted large amount of undigested food.  Patient feels better after as claimed

## 2013-01-05 LAB — GC/CHLAMYDIA PROBE AMP: CT Probe RNA: NEGATIVE

## 2013-01-22 ENCOUNTER — Other Ambulatory Visit: Payer: Self-pay

## 2013-05-12 ENCOUNTER — Other Ambulatory Visit: Payer: Self-pay

## 2013-05-12 DIAGNOSIS — Z1231 Encounter for screening mammogram for malignant neoplasm of breast: Secondary | ICD-10-CM

## 2013-06-03 ENCOUNTER — Ambulatory Visit
Admission: RE | Admit: 2013-06-03 | Discharge: 2013-06-03 | Disposition: A | Discharge status: Home / Self Care | Payer: PRIVATE HEALTH INSURANCE | Source: Ambulatory Visit

## 2013-06-03 ENCOUNTER — Other Ambulatory Visit: Payer: Self-pay | Admitting: Internal Medicine

## 2013-06-03 ENCOUNTER — Ambulatory Visit
Admission: RE | Admit: 2013-06-03 | Discharge: 2013-06-03 | Disposition: A | Discharge status: Home / Self Care | Payer: PRIVATE HEALTH INSURANCE | Source: Ambulatory Visit | Attending: Internal Medicine | Admitting: Internal Medicine

## 2013-06-03 DIAGNOSIS — Z0000 Encounter for general adult medical examination without abnormal findings: Secondary | ICD-10-CM

## 2013-06-03 DIAGNOSIS — Z1231 Encounter for screening mammogram for malignant neoplasm of breast: Secondary | ICD-10-CM

## 2013-06-08 ENCOUNTER — Other Ambulatory Visit: Payer: Self-pay | Admitting: Internal Medicine

## 2013-06-08 DIAGNOSIS — R928 Other abnormal and inconclusive findings on diagnostic imaging of breast: Secondary | ICD-10-CM

## 2013-06-12 ENCOUNTER — Ambulatory Visit
Admission: RE | Admit: 2013-06-12 | Discharge: 2013-06-12 | Disposition: A | Discharge status: Home / Self Care | Payer: PRIVATE HEALTH INSURANCE | Source: Ambulatory Visit | Attending: Internal Medicine | Admitting: Internal Medicine

## 2013-06-12 DIAGNOSIS — R928 Other abnormal and inconclusive findings on diagnostic imaging of breast: Secondary | ICD-10-CM

## 2013-06-16 ENCOUNTER — Other Ambulatory Visit: Payer: PRIVATE HEALTH INSURANCE

## 2013-07-29 ENCOUNTER — Ambulatory Visit (INDEPENDENT_AMBULATORY_CARE_PROVIDER_SITE_OTHER): Payer: Managed Care, Other (non HMO) | Admitting: Physician Assistant

## 2013-07-29 VITALS — BP 106/70 | HR 90 | Temp 99.3°F | Resp 16 | Ht 65.25 in | Wt 227.0 lb

## 2013-07-29 DIAGNOSIS — R29898 Other symptoms and signs involving the musculoskeletal system: Secondary | ICD-10-CM

## 2013-07-29 DIAGNOSIS — M25542 Pain in joints of left hand: Secondary | ICD-10-CM

## 2013-07-29 DIAGNOSIS — M654 Radial styloid tenosynovitis [de Quervain]: Secondary | ICD-10-CM

## 2013-07-29 MED ORDER — DICLOFENAC SODIUM 75 MG PO TBEC: 75.0000 mg | DELAYED_RELEASE_TABLET | Freq: Two times a day (BID) | ORAL | Status: DC

## 2013-07-29 NOTE — Patient Instructions (Signed)
De Quervain's Disease De Quervain's disease is a condition often seen in racquet sports where there is a soreness (inflammation) in the cord like structures (tendons) which attach muscle to bone on the thumb side of the wrist. There may be a tightening of the tissuesaround the tendons. This condition is often helped by giving up or modifying the activity which caused it. When conservative treatment does not help, surgery may be required. Conservative treatment could include changes in the activity which brought about the problem or made it worse. Anti-inflammatory medications and injections may be used to help decrease the inflammation and help with pain control. Your caregiver will help you determine which is best for you. DIAGNOSIS  Often the diagnosis (learning what is wrong) can be made by examination. Sometimes x-rays are required. HOME CARE INSTRUCTIONS   Apply ice to the sore area for 15-20 minutes, 03-04 times per day while awake. Put the ice in a plastic bag and place a towel between the bag of ice and your skin. This is especially helpful if it can be done after all activities involving the sore wrist.  Temporary splinting may help.  Only take over-the-counter or prescription medicines for pain, discomfort or fever as directed by your caregiver. SEEK MEDICAL CARE IF:   Pain relief is not obtained with medications, or if you have increasing pain and seem to be getting worse rather than better. MAKE SURE YOU:   Understand these instructions.  Will watch your condition.  Will get help right away if you are not doing well or get worse. Document Released: 11/28/2000 Document Revised: 05/28/2011 Document Reviewed: 03/05/2005 [employer] Patient Information 2014 [employer].  

## 2013-07-30 NOTE — Progress Notes (Signed)
   Subjective:    Patient ID: Shelby Case, female    DOB: [birth year], 38 y.o.   MRN: [id number]  HPI 38 year old female presents for evaluation of left thumb pain and popping x 4 days.  States pain has been progressively worsening, and tonight became so severe that she left work to come for evaluation.  She has taken ibuprofen 800 mg sporadically, usually only once per day. Admits this does help significantly with the pain.  Also reports similar episode 1 year ago that resolved spontaneously.  She has been wearing an elastic wrist splint which does not seem to be helping at all. Does have full ROM of her thumb and fingers - admits to pain with ROM of her thumb. No weakness or paresthesias.  She is right handed.  No injury or trauma. Works at [employer] job.  No heavy lifting Patient is otherwise doing well with no other concerns today.      Review of Systems  Musculoskeletal: Positive for arthralgias and joint swelling.  Skin: Negative for color change and rash.  Neurological: Negative for weakness and numbness.       Objective:   Physical Exam  Constitutional: She is oriented to person, place, and time. She appears well-developed and well-nourished.  HENT:  Head: Normocephalic and atraumatic.  Right Ear: External ear normal.  Left Ear: External ear normal.  Eyes: Conjunctivae are normal.  Neck: Normal range of motion.  Cardiovascular: Normal rate.   Pulmonary/Chest: Effort normal.  Musculoskeletal:  Left thumb +TTP along dorsum of thumb along the extensor tendons of the thumb.  +Finklestein test. Capillary refill normal <2 seconds. Sensation intact. Full ROM, pain with extension of thumb. No bony tenderness.   Neurological: She is alert and oriented to person, place, and time.  Psychiatric: She has a normal mood and affect. Her behavior is normal. Judgment and thought content normal.          Assessment & Plan:  De Quervain's tenosynovitis - Plan: diclofenac (VOLTAREN) 75  MG EC tablet  Pain, joint, hand, left  Placed in thumb spica splint for comfort to wear for 1 week.  Diclofenac 75 mg bid with food. Stop ibuprofen.  Recommend recheck or ortho eval if no improvement in 1 week, sooner if worse

## 2014-06-22 ENCOUNTER — Other Ambulatory Visit: Payer: Self-pay

## 2014-06-22 DIAGNOSIS — Z1231 Encounter for screening mammogram for malignant neoplasm of breast: Secondary | ICD-10-CM

## 2014-07-05 ENCOUNTER — Ambulatory Visit
Admission: RE | Admit: 2014-07-05 | Discharge: 2014-07-05 | Disposition: A | Discharge status: Home / Self Care | Payer: BLUE CROSS/BLUE SHIELD | Source: Ambulatory Visit

## 2014-07-05 DIAGNOSIS — Z1231 Encounter for screening mammogram for malignant neoplasm of breast: Secondary | ICD-10-CM

## 2015-06-08 ENCOUNTER — Other Ambulatory Visit: Payer: Self-pay

## 2018-04-14 ENCOUNTER — Ambulatory Visit: Payer: Managed Care, Other (non HMO) | Admitting: Nurse Practitioner

## 2018-07-24 ENCOUNTER — Telehealth: Payer: Self-pay

## 2018-07-24 NOTE — Telephone Encounter (Signed)
Pt consented to virtual visit 07/24/18

## 2018-07-28 ENCOUNTER — Ambulatory Visit: Payer: BLUE CROSS/BLUE SHIELD | Admitting: Nurse Practitioner

## 2018-07-28 ENCOUNTER — Other Ambulatory Visit: Payer: Self-pay

## 2018-08-21 DIAGNOSIS — L219 Seborrheic dermatitis, unspecified: Secondary | ICD-10-CM | POA: Diagnosis not present

## 2018-08-21 DIAGNOSIS — L658 Other specified nonscarring hair loss: Secondary | ICD-10-CM | POA: Insufficient documentation

## 2018-08-21 DIAGNOSIS — L602 Onychogryphosis: Secondary | ICD-10-CM | POA: Insufficient documentation

## 2018-08-21 DIAGNOSIS — L700 Acne vulgaris: Secondary | ICD-10-CM | POA: Diagnosis not present

## 2018-08-21 DIAGNOSIS — L603 Nail dystrophy: Secondary | ICD-10-CM | POA: Diagnosis not present

## 2018-09-16 ENCOUNTER — Telehealth: Payer: Self-pay

## 2018-09-16 ENCOUNTER — Other Ambulatory Visit: Payer: Self-pay

## 2018-09-16 ENCOUNTER — Other Ambulatory Visit: Payer: Self-pay | Admitting: Nurse Practitioner

## 2018-09-16 MED ORDER — METFORMIN HCL 500 MG PO TABS: ORAL_TABLET | ORAL | 0 refills | Status: DC

## 2018-09-16 NOTE — Telephone Encounter (Signed)
Patient called requesting a refill on metformin and antibiotic ointment. [phone]  RETURNED PT CALL AND ATTEMPTED TO SCHEDULE AN APPOINTMENT DUE TO PT NOT BEING SEEN SINCE 10/07/17 AND SHE IS ON METFORMIN SHE WAS SUPPOSED TO HAVE A 6 MONTH F/U. PT REFUSED TO SCHEDULE AN APPOINTMENT TO COME INTO THE OFFICE AND ALSO REFUSED A VITURAL APPOINTMENT SHE STATED SHE WAS NOT GOING TO PAY $20 TO HAVE SOMEONE JUST LOOK AT HER AND I ADVISED PT THAT WE WOULD BE DO BLOODWORK AND CHECK HER LEVELS AND PT STILL DECLINED. YRL,RMA

## 2018-10-13 ENCOUNTER — Ambulatory Visit: Payer: BC Managed Care – PPO | Admitting: Nurse Practitioner

## 2018-10-13 ENCOUNTER — Other Ambulatory Visit: Payer: Self-pay

## 2018-10-13 ENCOUNTER — Encounter: Payer: Self-pay | Admitting: Nurse Practitioner

## 2018-10-13 VITALS — BP 126/82 | HR 88 | Temp 98.0°F | Ht 66.6 in | Wt 229.2 lb

## 2018-10-13 DIAGNOSIS — R7309 Other abnormal glucose: Secondary | ICD-10-CM | POA: Diagnosis not present

## 2018-10-13 DIAGNOSIS — E6609 Other obesity due to excess calories: Secondary | ICD-10-CM

## 2018-10-13 DIAGNOSIS — Z0000 Encounter for general adult medical examination without abnormal findings: Secondary | ICD-10-CM

## 2018-10-13 DIAGNOSIS — M62838 Other muscle spasm: Secondary | ICD-10-CM

## 2018-10-13 DIAGNOSIS — Z139 Encounter for screening, unspecified: Secondary | ICD-10-CM

## 2018-10-13 DIAGNOSIS — E559 Vitamin D deficiency, unspecified: Secondary | ICD-10-CM | POA: Diagnosis not present

## 2018-10-13 DIAGNOSIS — Z6836 Body mass index (BMI) 36.0-36.9, adult: Secondary | ICD-10-CM

## 2018-10-13 LAB — POCT URINALYSIS DIPSTICK
Bilirubin, UA: NEGATIVE
Blood, UA: NEGATIVE
Glucose, UA: NEGATIVE
Ketones, UA: NEGATIVE
Leukocytes, UA: NEGATIVE
Nitrite, UA: NEGATIVE
Protein, UA: NEGATIVE
Spec Grav, UA: 1.015 (ref 1.010–1.025)
Urobilinogen, UA: 0.2 E.U./dL
pH, UA: 7 (ref 5.0–8.0)

## 2018-10-13 MED ORDER — RYBELSUS 7 MG PO TABS: 7.0000 mg | ORAL_TABLET | Freq: Every day | ORAL | 0 refills | Status: DC

## 2018-10-13 MED ORDER — RYBELSUS 3 MG PO TABS: 3.0000 mg | ORAL_TABLET | Freq: Every day | ORAL | 0 refills | Status: DC

## 2018-10-13 MED ORDER — CYCLOBENZAPRINE HCL 10 MG PO TABS: 10.0000 mg | ORAL_TABLET | Freq: Three times a day (TID) | ORAL | 0 refills | Status: DC | PRN

## 2018-10-13 NOTE — Progress Notes (Addendum)
Subjective:     Patient ID: Shelby Case , female    DOB: 10/03/1975 , 43 y.o.   MRN: [id number]   Chief Complaint  Patient presents with  . Annual Exam   The patient states she uses none for birth control. Last LMP was Patient's last menstrual period was 10/07/2018.. Negative for Dysmenorrhea and Negative for Menorrhagia Mammogram last done 2019 - has done at Dr. Garwin Brothers office.  Negative for: breast discharge, breast lump(s), breast pain and breast self exam.  Pertinent negatives include abnormal bleeding (hematology), anxiety, decreased libido, depression, difficulty falling sleep, dyspareunia, history of infertility, nocturia, sexual dysfunction, sleep disturbances, urinary incontinence, urinary urgency, vaginal discharge and vaginal itching. Diet regular, watching her carbohydrate intake.The patient states her exercise level is  walking 30 minutes 3 days per week.       The patient's tobacco use is:  Social History   Tobacco Use  Smoking Status Light Tobacco Smoker  . Last attempt to quit: 12/27/2012  . Years since quitting: 5.7  Smokeless Tobacco Never Used   She has been exposed to passive smoke. The patient's alcohol use is:  Social History   Substance and Sexual Activity  Alcohol Use No   Additional information: Last pap 2019 Dr. Garwin Brothers, next one scheduled for 2020.   HPI  Here for HM    Past Medical History:  Diagnosis Date  . Acute blood loss anemia 01/03/2013  . Medical history non-contributory   . Metrorrhagia 01/03/2013     Family History  Problem Relation Age of Onset  . Irregular heart beat Father   . Hypertension Brother      Current Outpatient Medications:  .  ibuprofen (ADVIL,MOTRIN) 800 MG tablet, Take 1 tablet (800 mg total) by mouth 3 (three) times daily., Disp: 21 tablet, Rfl: 0 .  metFORMIN (GLUCOPHAGE) 500 MG tablet, TAKE 1.5 TABLET BY MOUTH EVERY DAY, Disp: 30 tablet, Rfl: 0 .  mupirocin ointment (BACTROBAN) 2 %, APPLY A SMALL AMOUNT  TOPICALLY TO AFFECTED AREA 3 TIMES A DAY, Disp: 22 g, Rfl: 2 .  vitamin C (ASCORBIC ACID) 500 MG tablet, Take 500 mg by mouth daily., Disp: , Rfl:    Allergies  Allergen Reactions  . Penicillins Other (See Comments)    Childhood reaction      Review of Systems  Constitutional: Negative.   HENT: Negative.   Eyes: Negative.   Respiratory: Negative.   Cardiovascular: Negative.   Gastrointestinal: Negative.   Endocrine: Negative.   Genitourinary: Negative.   Musculoskeletal: Positive for neck pain (near shoulder area).  Skin: Negative.   Allergic/Immunologic: Negative.   Neurological: Negative.   Hematological: Negative.   Psychiatric/Behavioral: Negative.      Today's Vitals   10/13/18 1119  BP: 126/82  Pulse: 88  Temp: 98 F (36.7 C)  TempSrc: Oral  Weight: 229 lb 3.2 oz (104 kg)  Height: 5' 6.6" (1.692 m)  PainSc: 0-No pain   Body mass index is 36.33 kg/m.   Objective:  Physical Exam Constitutional:      Appearance: Normal appearance. She is well-developed.  HENT:     Head: Normocephalic and atraumatic.     Right Ear: Hearing, tympanic membrane, ear canal and external ear normal.     Left Ear: Hearing, tympanic membrane, ear canal and external ear normal.  Eyes:     General: Lids are normal.     Conjunctiva/sclera: Conjunctivae normal.     Pupils: Pupils are equal, round, and reactive to light.  Funduscopic exam:    Right eye: No papilledema.        Left eye: No papilledema.  Neck:     Musculoskeletal: Full passive range of motion without pain, normal range of motion and neck supple.     Thyroid: No thyroid mass.     Vascular: No carotid bruit.  Cardiovascular:     Rate and Rhythm: Normal rate and regular rhythm.     Pulses: Normal pulses.     Heart sounds: Normal heart sounds. No murmur.  Pulmonary:     Effort: Pulmonary effort is normal.     Breath sounds: Normal breath sounds.  Abdominal:     General: Abdomen is flat. Bowel sounds are normal.      Palpations: Abdomen is soft.  Musculoskeletal: Normal range of motion.        General: No swelling.     Right lower leg: No edema.     Left lower leg: No edema.  Skin:    General: Skin is warm and dry.     Capillary Refill: Capillary refill takes less than 2 seconds.  Neurological:     General: No focal deficit present.     Mental Status: She is alert and oriented to person, place, and time.     Cranial Nerves: No cranial nerve deficit.     Sensory: No sensory deficit.  Psychiatric:        Mood and Affect: Mood normal.        Behavior: Behavior normal.        Thought Content: Thought content normal.        Judgment: Judgment normal.         Assessment And Plan:     1. Encounter for general adult medical examination w/o abnormal findings . Behavior modifications discussed and diet history reviewed.   . Pt will continue to exercise regularly and modify diet with low GI, plant based foods and decrease intake of processed foods.  . Recommend intake of daily multivitamin, Vitamin D, and calcium.  . Recommend mammogram and colonoscopy for preventive screenings, as well as recommend immunizations that include influenza, TDAP - POCT Urinalysis Dipstick (81002) - CBC no Diff  2. 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 Abnormal glucose  Chronic  Will start on rybelsus, discussed side effects of nausea.  Denies history of pancreatitis or thyroid cancer - BMP8+Anion Gap - Hemoglobin A1c - Semaglutide (RYBELSUS) 7 MG TABS; Take 7 mg by mouth daily.  Dispense: 30 tablet; Refill: 0  4. Vitamin D deficiency  Will check vitamin D level and supplement as needed.     Also encouraged to spend 15 minutes in the sun  daily.   - Vitamin D (25 hydroxy)  5. Encounter for screening  - HIV antibody (with reflex)  6. Muscle spasm  Intermittent spasms to shoulder area  Encouraged to use heating pad as well as needed - cyclobenzaprine (FLEXERIL) 10 MG tablet; Take 1 tablet (10 mg total) by mouth 3 (three) times daily as needed for muscle spasms.  Dispense: 30 tablet; Refill: 0  Minette Brine, FNP    THE PATIENT IS ENCOURAGED TO PRACTICE SOCIAL DISTANCING DUE TO THE COVID-19 PANDEMIC.

## 2018-10-14 LAB — VITAMIN D 25 HYDROXY (VIT D DEFICIENCY, FRACTURES): Vit D, 25-Hydroxy: 32.1 ng/mL (ref 30.0–100.0)

## 2018-10-14 LAB — CBC
Hematocrit: 36.7 % (ref 34.0–46.6)
Hemoglobin: 12.7 g/dL (ref 11.1–15.9)
MCH: 32.1 pg (ref 26.6–33.0)
MCHC: 34.6 g/dL (ref 31.5–35.7)
MCV: 93 fL (ref 79–97)
Platelets: 324 10*3/uL (ref 150–450)
RBC: 3.96 x10E6/uL (ref 3.77–5.28)
RDW: 16.5 % — ABNORMAL HIGH (ref 11.7–15.4)
WBC: 6.6 10*3/uL (ref 3.4–10.8)

## 2018-10-14 LAB — LIPID PANEL
Chol/HDL Ratio: 5.3 ratio — ABNORMAL HIGH (ref 0.0–4.4)
Cholesterol, Total: 221 mg/dL — ABNORMAL HIGH (ref 100–199)
HDL: 42 mg/dL (ref >39)
LDL Calculated: 154 mg/dL — ABNORMAL HIGH (ref 0–99)
Triglycerides: 124 mg/dL (ref 0–149)
VLDL Cholesterol Cal: 25 mg/dL (ref 5–40)

## 2018-10-14 LAB — HIV ANTIBODY (ROUTINE TESTING W REFLEX): HIV Screen 4th Generation wRfx: NONREACTIVE

## 2018-10-14 LAB — BMP8+ANION GAP
Anion Gap: 14 mmol/L (ref 10.0–18.0)
BUN/Creatinine Ratio: 10 (ref 9–23)
BUN: 7 mg/dL (ref 6–24)
CO2: 22 mmol/L (ref 20–29)
Calcium: 9.6 mg/dL (ref 8.7–10.2)
Chloride: 105 mmol/L (ref 96–106)
Creatinine, Ser: 0.7 mg/dL (ref 0.57–1.00)
GFR calc Af Amer: 121 mL/min/{1.73_m2} (ref >59)
GFR calc non Af Amer: 105 mL/min/{1.73_m2} (ref >59)
Glucose: 92 mg/dL (ref 65–99)
Potassium: 4.2 mmol/L (ref 3.5–5.2)
Sodium: 141 mmol/L (ref 134–144)

## 2018-10-14 LAB — HEMOGLOBIN A1C
Est. average glucose Bld gHb Est-mCnc: 123 mg/dL
Hgb A1c MFr Bld: 5.9 % — ABNORMAL HIGH (ref 4.8–5.6)

## 2018-11-18 ENCOUNTER — Other Ambulatory Visit: Payer: Self-pay | Admitting: Nurse Practitioner

## 2018-11-18 DIAGNOSIS — R7309 Other abnormal glucose: Secondary | ICD-10-CM

## 2019-02-03 ENCOUNTER — Other Ambulatory Visit: Payer: Self-pay

## 2019-02-03 ENCOUNTER — Encounter: Payer: Self-pay | Admitting: Nurse Practitioner

## 2019-02-03 ENCOUNTER — Ambulatory Visit: Payer: BC Managed Care – PPO | Admitting: Nurse Practitioner

## 2019-02-03 VITALS — BP 124/80 | HR 80 | Temp 98.3°F | Ht 66.6 in | Wt 217.4 lb

## 2019-02-03 DIAGNOSIS — R7309 Other abnormal glucose: Secondary | ICD-10-CM

## 2019-02-03 DIAGNOSIS — E7800 Pure hypercholesterolemia, unspecified: Secondary | ICD-10-CM

## 2019-02-03 MED ORDER — RYBELSUS 14 MG PO TABS: 1.0000 | ORAL_TABLET | Freq: Every day | ORAL | 1 refills | Status: DC

## 2019-02-03 NOTE — Progress Notes (Signed)
  Subjective:     Patient ID: Shelby Case , female    DOB: 1975/07/29 , 43 y.o.   MRN: [id number]   Chief Complaint  Patient presents with  . abnormal glucose    HPI  She is taking Rybelsus daily. Last week she had some dizziness and realized she had not eaten enough.  She is exercising more.    Wt Readings from Last 3 Encounters: 02/03/19 : 217 lb 6.4 oz (98.6 kg) 10/13/18 : 229 lb 3.2 oz (104 kg) 07/29/13 : 227 lb (103 kg)     Past Medical History:  Diagnosis Date  . Acute blood loss anemia 01/03/2013  . Medical history non-contributory   . Metrorrhagia 01/03/2013     Family History  Problem Relation Age of Onset  . Irregular heart beat Father   . Hypertension Brother      Current Outpatient Medications:  .  mupirocin ointment (BACTROBAN) 2 %, APPLY A SMALL AMOUNT TOPICALLY TO AFFECTED AREA 3 TIMES A DAY, Disp: 22 g, Rfl: 2 .  RYBELSUS 7 MG TABS, TAKE 1 TABLET BY MOUTH ONCE DAILY., Disp: 30 tablet, Rfl: 0 .  vitamin C (ASCORBIC ACID) 500 MG tablet, Take 500 mg by mouth daily., Disp: , Rfl:  .  VITAMIN D PO, Take 1 tablet by mouth daily., Disp: , Rfl:  .  cyclobenzaprine (FLEXERIL) 10 MG tablet, Take 1 tablet (10 mg total) by mouth 3 (three) times daily as needed for muscle spasms. (Patient not taking: Reported on 02/03/2019), Disp: 30 tablet, Rfl: 0 .  ibuprofen (ADVIL,MOTRIN) 800 MG tablet, Take 1 tablet (800 mg total) by mouth 3 (three) times daily. (Patient not taking: Reported on 02/03/2019), Disp: 21 tablet, Rfl: 0 .  metFORMIN (GLUCOPHAGE) 500 MG tablet, TAKE 1.5 TABLET BY MOUTH EVERY DAY (Patient not taking: Reported on 02/03/2019), Disp: 30 tablet, Rfl: 0   Allergies  Allergen Reactions  . Penicillins Other (See Comments)    Childhood reaction      Review of Systems  Constitutional: Negative.   Respiratory: Negative.   Cardiovascular: Negative.   Neurological: Negative for dizziness.  Psychiatric/Behavioral: Negative.      Today's Vitals   02/03/19 1559  BP: 124/80  Pulse: 80  Temp: 98.3 F (36.8 C)  TempSrc: Oral  Weight: 217 lb 6.4 oz (98.6 kg)  Height: 5' 6.6" (1.692 m)  PainSc: 0-No pain   Body mass index is 34.46 kg/m.   Objective:  Physical Exam Constitutional:      General: She is not in acute distress.    Appearance: Normal appearance.  Cardiovascular:     Rate and Rhythm: Normal rate and regular rhythm.     Heart sounds: Normal heart sounds.  Neurological:     Mental Status: She is alert.         Assessment And Plan:     1. Abnormal glucose  Will check HgbA1c  Encouraged to avoid sugary foods and drinks and to increase her physical activty - Hemoglobin A1c - Lipid Profile  2. Elevated cholesterol  Slightly elevated at last visit  Will check lipid panel   Minette Brine, FNP    THE PATIENT IS ENCOURAGED TO PRACTICE SOCIAL DISTANCING DUE TO THE COVID-19 PANDEMIC.

## 2019-02-04 LAB — LIPID PANEL
Chol/HDL Ratio: 4.6 ratio — ABNORMAL HIGH (ref 0.0–4.4)
Cholesterol, Total: 209 mg/dL — ABNORMAL HIGH (ref 100–199)
HDL: 45 mg/dL (ref >39)
LDL Chol Calc (NIH): 143 mg/dL — ABNORMAL HIGH (ref 0–99)
Triglycerides: 117 mg/dL (ref 0–149)
VLDL Cholesterol Cal: 21 mg/dL (ref 5–40)

## 2019-02-04 LAB — HEMOGLOBIN A1C
Est. average glucose Bld gHb Est-mCnc: 108 mg/dL
Hgb A1c MFr Bld: 5.4 % (ref 4.8–5.6)

## 2019-02-18 DIAGNOSIS — Z20828 Contact with and (suspected) exposure to other viral communicable diseases: Secondary | ICD-10-CM | POA: Diagnosis not present

## 2019-02-23 DIAGNOSIS — Z1151 Encounter for screening for human papillomavirus (HPV): Secondary | ICD-10-CM | POA: Diagnosis not present

## 2019-02-23 DIAGNOSIS — Z6833 Body mass index (BMI) 33.0-33.9, adult: Secondary | ICD-10-CM | POA: Diagnosis not present

## 2019-02-23 DIAGNOSIS — Z1231 Encounter for screening mammogram for malignant neoplasm of breast: Secondary | ICD-10-CM | POA: Diagnosis not present

## 2019-02-23 DIAGNOSIS — Z01419 Encounter for gynecological examination (general) (routine) without abnormal findings: Secondary | ICD-10-CM | POA: Diagnosis not present

## 2019-02-23 LAB — HM MAMMOGRAPHY

## 2019-02-24 ENCOUNTER — Encounter: Payer: Self-pay | Admitting: Nurse Practitioner

## 2019-03-17 DIAGNOSIS — K5900 Constipation, unspecified: Secondary | ICD-10-CM | POA: Diagnosis not present

## 2019-03-17 DIAGNOSIS — K219 Gastro-esophageal reflux disease without esophagitis: Secondary | ICD-10-CM | POA: Diagnosis not present

## 2019-03-17 DIAGNOSIS — Z1211 Encounter for screening for malignant neoplasm of colon: Secondary | ICD-10-CM | POA: Diagnosis not present

## 2019-03-18 DIAGNOSIS — Z20828 Contact with and (suspected) exposure to other viral communicable diseases: Secondary | ICD-10-CM | POA: Diagnosis not present

## 2019-04-15 ENCOUNTER — Ambulatory Visit: Payer: BC Managed Care – PPO | Admitting: Nurse Practitioner

## 2019-06-09 ENCOUNTER — Ambulatory Visit: Payer: BC Managed Care – PPO | Admitting: Nurse Practitioner

## 2019-07-29 ENCOUNTER — Other Ambulatory Visit: Payer: Self-pay

## 2019-07-29 MED ORDER — RYBELSUS 14 MG PO TABS: 1.0000 | ORAL_TABLET | Freq: Every day | ORAL | 0 refills | Status: DC

## 2019-08-05 ENCOUNTER — Encounter: Payer: Self-pay | Admitting: Nurse Practitioner

## 2019-08-05 ENCOUNTER — Ambulatory Visit (INDEPENDENT_AMBULATORY_CARE_PROVIDER_SITE_OTHER): Payer: BC Managed Care – PPO | Admitting: Nurse Practitioner

## 2019-08-05 ENCOUNTER — Other Ambulatory Visit: Payer: Self-pay

## 2019-08-05 VITALS — BP 138/84 | HR 93 | Temp 98.5°F | Ht 66.6 in | Wt 193.8 lb

## 2019-08-05 DIAGNOSIS — E669 Obesity, unspecified: Secondary | ICD-10-CM | POA: Diagnosis not present

## 2019-08-05 DIAGNOSIS — R7309 Other abnormal glucose: Secondary | ICD-10-CM

## 2019-08-05 DIAGNOSIS — E559 Vitamin D deficiency, unspecified: Secondary | ICD-10-CM

## 2019-08-05 DIAGNOSIS — E7800 Pure hypercholesterolemia, unspecified: Secondary | ICD-10-CM

## 2019-08-05 MED ORDER — RYBELSUS 14 MG PO TABS: 1.0000 | ORAL_TABLET | Freq: Every day | ORAL | 3 refills | Status: DC

## 2019-08-05 NOTE — Progress Notes (Signed)
  Subjective:     Patient ID: Shelby Case , female    DOB: 10-08-1975 , 44 y.o.   MRN: [id number]   Chief Complaint  Patient presents with  . abnormal glucose  . Hyperlipidemia    HPI  She is taking Rybelsus daily.   Wt Readings from Last 3 Encounters: 08/05/19 : 193 lb 12.8 oz (87.9 kg) 02/03/19 : 217 lb 6.4 oz (98.6 kg) 10/13/18 : 229 lb 3.2 oz (104 kg)   Hyperlipidemia This is a chronic problem. The current episode started more than 1 year ago. The problem is controlled. Recent lipid tests were reviewed and are low. Exacerbating diseases include obesity. She has no history of chronic renal disease, diabetes or hypothyroidism. There are no known factors aggravating her hyperlipidemia. Pertinent negatives include no chest pain.     Past Medical History:  Diagnosis Date  . Acute blood loss anemia 01/03/2013  . Medical history non-contributory   . Metrorrhagia 01/03/2013     Family History  Problem Relation Age of Onset  . Irregular heart beat Father   . Hypertension Brother      Current Outpatient Medications:  .  mupirocin ointment (BACTROBAN) 2 %, APPLY A SMALL AMOUNT TOPICALLY TO AFFECTED AREA 3 TIMES A DAY, Disp: 22 g, Rfl: 2 .  Semaglutide (RYBELSUS) 14 MG TABS, Take 1 tablet by mouth daily., Disp: 30 tablet, Rfl: 3 .  vitamin C (ASCORBIC ACID) 500 MG tablet, Take 500 mg by mouth daily., Disp: , Rfl:  .  VITAMIN D PO, Take 1 tablet by mouth daily., Disp: , Rfl:    Allergies  Allergen Reactions  . Penicillins Other (See Comments)    Childhood reaction      Review of Systems  Constitutional: Negative.   Respiratory: Negative.   Cardiovascular: Negative.  Negative for chest pain.  Neurological: Negative for dizziness.  Psychiatric/Behavioral: Negative.      Today's Vitals   08/05/19 1049  BP: 138/84  Pulse: 93  Temp: 98.5 F (36.9 C)  TempSrc: Oral  Weight: 193 lb 12.8 oz (87.9 kg)  Height: 5' 6.6" (1.692 m)  PainSc: 0-No pain   Body mass  index is 30.72 kg/m.   Objective:  Physical Exam Constitutional:      General: She is not in acute distress.    Appearance: Normal appearance.  Cardiovascular:     Rate and Rhythm: Normal rate and regular rhythm.     Heart sounds: Normal heart sounds.  Neurological:     Mental Status: She is alert.         Assessment And Plan:     1. Abnormal glucose  Will check HgbA1c  Encouraged to avoid sugary foods and drinks and to increase her physical activty - Hemoglobin A1c - Lipid Profile - Semaglutide (RYBELSUS) 14 MG TABS; Take 1 tablet by mouth daily.  Dispense: 30 tablet; Refill: 3  2. Elevated cholesterol  Slightly elevated at last visit  Will check lipid panel  3. Vitamin D deficiency Will check vitamin D level and supplement as needed.    Also encouraged to spend 15 minutes in the sun daily.   4. Obesity (BMI 30.0-34.9)  She has lost 24 lbs since her last visit, continues to exercise regularly   Minette Brine, FNP    THE PATIENT IS ENCOURAGED TO PRACTICE SOCIAL DISTANCING DUE TO THE COVID-19 PANDEMIC.

## 2019-08-06 LAB — HEMOGLOBIN A1C
Est. average glucose Bld gHb Est-mCnc: 117 mg/dL
Hgb A1c MFr Bld: 5.7 % — ABNORMAL HIGH (ref 4.8–5.6)

## 2019-08-06 LAB — LIPID PANEL
Chol/HDL Ratio: 5.5 ratio — ABNORMAL HIGH (ref 0.0–4.4)
Cholesterol, Total: 192 mg/dL (ref 100–199)
HDL: 35 mg/dL — ABNORMAL LOW (ref >39)
LDL Chol Calc (NIH): 140 mg/dL — ABNORMAL HIGH (ref 0–99)
Triglycerides: 91 mg/dL (ref 0–149)
VLDL Cholesterol Cal: 17 mg/dL (ref 5–40)

## 2019-08-24 DIAGNOSIS — Z1211 Encounter for screening for malignant neoplasm of colon: Secondary | ICD-10-CM | POA: Diagnosis not present

## 2019-08-24 LAB — HM COLONOSCOPY

## 2019-08-25 ENCOUNTER — Encounter: Payer: Self-pay | Admitting: Nurse Practitioner

## 2019-10-04 ENCOUNTER — Other Ambulatory Visit: Payer: Self-pay | Admitting: Nurse Practitioner

## 2019-10-19 ENCOUNTER — Encounter: Payer: BC Managed Care – PPO | Admitting: Nurse Practitioner

## 2019-11-19 DIAGNOSIS — L700 Acne vulgaris: Secondary | ICD-10-CM | POA: Diagnosis not present

## 2019-11-19 DIAGNOSIS — L602 Onychogryphosis: Secondary | ICD-10-CM | POA: Diagnosis not present

## 2019-11-19 DIAGNOSIS — L658 Other specified nonscarring hair loss: Secondary | ICD-10-CM | POA: Diagnosis not present

## 2019-11-19 DIAGNOSIS — L650 Telogen effluvium: Secondary | ICD-10-CM | POA: Insufficient documentation

## 2019-12-08 DIAGNOSIS — Z124 Encounter for screening for malignant neoplasm of cervix: Secondary | ICD-10-CM | POA: Diagnosis not present

## 2019-12-08 DIAGNOSIS — L659 Nonscarring hair loss, unspecified: Secondary | ICD-10-CM | POA: Diagnosis not present

## 2019-12-08 DIAGNOSIS — N921 Excessive and frequent menstruation with irregular cycle: Secondary | ICD-10-CM | POA: Diagnosis not present

## 2019-12-08 DIAGNOSIS — Z01419 Encounter for gynecological examination (general) (routine) without abnormal findings: Secondary | ICD-10-CM | POA: Diagnosis not present

## 2019-12-18 DIAGNOSIS — R7301 Impaired fasting glucose: Secondary | ICD-10-CM | POA: Diagnosis not present

## 2019-12-18 DIAGNOSIS — L659 Nonscarring hair loss, unspecified: Secondary | ICD-10-CM | POA: Diagnosis not present

## 2019-12-18 DIAGNOSIS — N926 Irregular menstruation, unspecified: Secondary | ICD-10-CM | POA: Diagnosis not present

## 2020-02-15 ENCOUNTER — Other Ambulatory Visit: Payer: BLUE CROSS/BLUE SHIELD

## 2020-02-15 DIAGNOSIS — Z20822 Contact with and (suspected) exposure to covid-19: Secondary | ICD-10-CM | POA: Diagnosis not present

## 2020-02-17 LAB — SARS-COV-2, NAA 2 DAY TAT

## 2020-02-17 LAB — NOVEL CORONAVIRUS, NAA: SARS-CoV-2, NAA: NOT DETECTED

## 2020-02-25 DIAGNOSIS — Z1231 Encounter for screening mammogram for malignant neoplasm of breast: Secondary | ICD-10-CM | POA: Diagnosis not present

## 2020-02-25 LAB — HM MAMMOGRAPHY: HM Mammogram: NORMAL (ref 0–4)

## 2020-03-31 DIAGNOSIS — R7301 Impaired fasting glucose: Secondary | ICD-10-CM | POA: Diagnosis not present

## 2020-03-31 DIAGNOSIS — N926 Irregular menstruation, unspecified: Secondary | ICD-10-CM | POA: Diagnosis not present

## 2020-03-31 DIAGNOSIS — L659 Nonscarring hair loss, unspecified: Secondary | ICD-10-CM | POA: Diagnosis not present

## 2020-05-10 ENCOUNTER — Other Ambulatory Visit: Payer: Self-pay | Admitting: Nurse Practitioner

## 2020-05-10 DIAGNOSIS — R7309 Other abnormal glucose: Secondary | ICD-10-CM

## 2020-05-18 ENCOUNTER — Ambulatory Visit: Payer: BC Managed Care – PPO | Admitting: Nurse Practitioner

## 2020-05-18 ENCOUNTER — Other Ambulatory Visit: Payer: Self-pay

## 2020-05-18 ENCOUNTER — Encounter: Payer: Self-pay | Admitting: Nurse Practitioner

## 2020-05-18 VITALS — BP 118/80 | HR 75 | Temp 98.2°F | Ht 66.6 in | Wt 188.6 lb

## 2020-05-18 DIAGNOSIS — Z6829 Body mass index (BMI) 29.0-29.9, adult: Secondary | ICD-10-CM | POA: Diagnosis not present

## 2020-05-18 DIAGNOSIS — Z1159 Encounter for screening for other viral diseases: Secondary | ICD-10-CM

## 2020-05-18 DIAGNOSIS — E559 Vitamin D deficiency, unspecified: Secondary | ICD-10-CM | POA: Diagnosis not present

## 2020-05-18 DIAGNOSIS — E7800 Pure hypercholesterolemia, unspecified: Secondary | ICD-10-CM | POA: Diagnosis not present

## 2020-05-18 DIAGNOSIS — R7309 Other abnormal glucose: Secondary | ICD-10-CM

## 2020-05-18 MED ORDER — RYBELSUS 14 MG PO TABS: 1.0000 | ORAL_TABLET | Freq: Every day | ORAL | 1 refills | Status: DC

## 2020-05-18 NOTE — Progress Notes (Signed)
I,Yamilka [employer] as a Education administrator for [employer], FNP.,have documented all relevant documentation on the behalf of Minette Brine, FNP,as directed by  Minette Brine, FNP while in the presence of Minette Brine, Gamaliel. This visit occurred during the SARS-CoV-2 public health emergency.  Safety protocols were in place, including screening questions prior to the visit, additional usage of staff PPE, and extensive cleaning of exam room while observing appropriate contact time as indicated for disinfecting solutions.  Subjective:     Patient ID: Shelby Case , female    DOB: 06-Dec-1975 , 45 y.o.   MRN: [id number]   Chief Complaint  Patient presents with  . Abnormal glucose    HPI  Patient presents today for a f/u on her abnormal glucose. She is currently taking rybelsus and is tolerating it well. She is walking for her exercise.  She is trying to get her abdomen down.   Wt Readings from Last 3 Encounters: 05/18/20 : 188 lb 9.6 oz (85.5 kg) 08/05/19 : 193 lb 12.8 oz (87.9 kg) 02/03/19 : 217 lb 6.4 oz (98.6 kg)     Past Medical History:  Diagnosis Date  . Acute blood loss anemia 01/03/2013  . Medical history non-contributory   . Metrorrhagia 01/03/2013     Family History  Problem Relation Age of Onset  . Irregular heart beat Father   . Hypertension Brother      Current Outpatient Medications:  .  mupirocin ointment (BACTROBAN) 2 %, APPLY A SMALL AMOUNT TOPICALLY TO AFFECTED AREA 3 TIMES A DAY, Disp: 22 g, Rfl: 2 .  vitamin C (ASCORBIC ACID) 500 MG tablet, Take 500 mg by mouth daily., Disp: , Rfl:  .  VITAMIN D PO, Take 1 tablet by mouth daily., Disp: , Rfl:  .  Semaglutide (RYBELSUS) 14 MG TABS, Take 1 tablet by mouth daily., Disp: 90 tablet, Rfl: 1   Allergies  Allergen Reactions  . Penicillins Other (See Comments)    Childhood reaction      Review of Systems  Constitutional: Negative.   HENT: Negative.   Eyes: Negative.   Respiratory: Negative.    Cardiovascular: Negative.  Negative for chest pain, palpitations and leg swelling.  Gastrointestinal: Negative.   Endocrine: Negative.   Genitourinary: Negative.   Musculoskeletal: Negative.   Skin: Negative.   Neurological: Negative.  Negative for dizziness and headaches.  Hematological: Negative.   Psychiatric/Behavioral: Negative.      Today's Vitals   05/18/20 1020  BP: 118/80  Pulse: 75  Temp: 98.2 F (36.8 C)  TempSrc: Oral  Weight: 188 lb 9.6 oz (85.5 kg)  Height: 5' 6.6" (1.692 m)  PainSc: 0-No pain   Body mass index is 29.89 kg/m.   Objective:  Physical Exam Constitutional:      General: She is not in acute distress.    Appearance: Normal appearance. She is normal weight.  Cardiovascular:     Rate and Rhythm: Normal rate and regular rhythm.     Pulses: Normal pulses.     Heart sounds: Normal heart sounds. No murmur heard.   Pulmonary:     Effort: Pulmonary effort is normal. No respiratory distress.     Breath sounds: Normal breath sounds. No wheezing.  Abdominal:     General: Abdomen is flat. Bowel sounds are normal.     Palpations: Abdomen is soft.  Musculoskeletal:        General: Normal range of motion.     Cervical back: Normal range of motion and neck  supple.  Skin:    General: Skin is warm.     Capillary Refill: Capillary refill takes less than 2 seconds.  Neurological:     General: No focal deficit present.     Mental Status: She is alert and oriented to person, place, and time.     Cranial Nerves: No cranial nerve deficit.  Psychiatric:        Mood and Affect: Mood normal.        Behavior: Behavior normal.        Thought Content: Thought content normal.        Judgment: Judgment normal.         Assessment And Plan:     1. Abnormal glucose  Chronic, controlled  Continue with current medications  Encouraged to limit intake of sugary foods and drinks  Continue with physical activity to 150 minutes per week - CMP14+EGFR -  Hemoglobin A1c - Semaglutide (RYBELSUS) 14 MG TABS; Take 1 tablet by mouth daily.  Dispense: 90 tablet; Refill: 1  2. Elevated cholesterol  Chronic, controlled  No current medications - Lipid panel  3. Vitamin D deficiency  Will check vitamin D level and supplement as needed.     Also encouraged to spend 15 minutes in the sun daily.  - VITAMIN D 25 Hydroxy (Vit-D Deficiency, Fractures)  4. BMI 29.0-29.9,adult  She is encouraged to strive for BMI less than 25 to decrease cardiac risk. Advised to aim for at least 150 minutes of exercise per week.  5. Encounter for hepatitis C screening test for low risk patient  Will check Hepatitis C screening due to recent recommendations to screen all adults 18 years and older - Hepatitis C antibody     Patient was given opportunity to ask questions. Patient verbalized understanding of the plan and was able to repeat key elements of the plan. All questions were answered to their satisfaction.  Minette Brine, FNP   I, Minette Brine, FNP, have reviewed all documentation for this visit. The documentation on 05/18/20 for the exam, diagnosis, procedures, and orders are all accurate and complete.   THE PATIENT IS ENCOURAGED TO PRACTICE SOCIAL DISTANCING DUE TO THE COVID-19 PANDEMIC.

## 2020-05-19 LAB — HEPATITIS C ANTIBODY: Hep C Virus Ab: <0.1 s/co ratio (ref 0.0–0.9)

## 2020-05-19 LAB — CMP14+EGFR
ALT: 12 IU/L (ref 0–32)
AST: 13 IU/L (ref 0–40)
Albumin/Globulin Ratio: 1.8 (ref 1.2–2.2)
Albumin: 4.4 g/dL (ref 3.8–4.8)
Alkaline Phosphatase: 54 IU/L (ref 44–121)
BUN/Creatinine Ratio: 15 (ref 9–23)
BUN: 11 mg/dL (ref 6–24)
Bilirubin Total: 0.3 mg/dL (ref 0.0–1.2)
CO2: 22 mmol/L (ref 20–29)
Calcium: 9.6 mg/dL (ref 8.7–10.2)
Chloride: 103 mmol/L (ref 96–106)
Creatinine, Ser: 0.71 mg/dL (ref 0.57–1.00)
Globulin, Total: 2.4 g/dL (ref 1.5–4.5)
Glucose: 80 mg/dL (ref 65–99)
Potassium: 4.1 mmol/L (ref 3.5–5.2)
Sodium: 139 mmol/L (ref 134–144)
Total Protein: 6.8 g/dL (ref 6.0–8.5)
eGFR: 106 mL/min/{1.73_m2} (ref >59)

## 2020-05-19 LAB — HEMOGLOBIN A1C
Est. average glucose Bld gHb Est-mCnc: 105 mg/dL
Hgb A1c MFr Bld: 5.3 % (ref 4.8–5.6)

## 2020-05-19 LAB — LIPID PANEL
Chol/HDL Ratio: 5.2 ratio — ABNORMAL HIGH (ref 0.0–4.4)
Cholesterol, Total: 209 mg/dL — ABNORMAL HIGH (ref 100–199)
HDL: 40 mg/dL (ref >39)
LDL Chol Calc (NIH): 152 mg/dL — ABNORMAL HIGH (ref 0–99)
Triglycerides: 92 mg/dL (ref 0–149)
VLDL Cholesterol Cal: 17 mg/dL (ref 5–40)

## 2020-05-19 LAB — VITAMIN D 25 HYDROXY (VIT D DEFICIENCY, FRACTURES): Vit D, 25-Hydroxy: 37.7 ng/mL (ref 30.0–100.0)

## 2020-06-09 ENCOUNTER — Encounter: Payer: Self-pay | Admitting: Nurse Practitioner

## 2020-07-22 DIAGNOSIS — L608 Other nail disorders: Secondary | ICD-10-CM | POA: Diagnosis not present

## 2020-07-22 DIAGNOSIS — B351 Tinea unguium: Secondary | ICD-10-CM | POA: Diagnosis not present

## 2020-07-22 DIAGNOSIS — M7989 Other specified soft tissue disorders: Secondary | ICD-10-CM | POA: Diagnosis not present

## 2020-07-22 DIAGNOSIS — L814 Other melanin hyperpigmentation: Secondary | ICD-10-CM | POA: Diagnosis not present

## 2020-07-22 DIAGNOSIS — B353 Tinea pedis: Secondary | ICD-10-CM | POA: Diagnosis not present

## 2020-07-22 DIAGNOSIS — L989 Disorder of the skin and subcutaneous tissue, unspecified: Secondary | ICD-10-CM | POA: Diagnosis not present

## 2020-07-22 DIAGNOSIS — M2011 Hallux valgus (acquired), right foot: Secondary | ICD-10-CM | POA: Diagnosis not present

## 2020-08-29 ENCOUNTER — Ambulatory Visit: Payer: BC Managed Care – PPO | Admitting: Nurse Practitioner

## 2020-09-05 DIAGNOSIS — G4700 Insomnia, unspecified: Secondary | ICD-10-CM | POA: Diagnosis not present

## 2020-09-05 DIAGNOSIS — Z1159 Encounter for screening for other viral diseases: Secondary | ICD-10-CM | POA: Diagnosis not present

## 2020-09-05 DIAGNOSIS — Z113 Encounter for screening for infections with a predominantly sexual mode of transmission: Secondary | ICD-10-CM | POA: Diagnosis not present

## 2020-09-12 DIAGNOSIS — F330 Major depressive disorder, recurrent, mild: Secondary | ICD-10-CM | POA: Diagnosis not present

## 2020-09-21 DIAGNOSIS — F330 Major depressive disorder, recurrent, mild: Secondary | ICD-10-CM | POA: Diagnosis not present

## 2020-11-24 ENCOUNTER — Encounter: Payer: BC Managed Care – PPO | Admitting: Nurse Practitioner

## 2020-11-24 NOTE — Patient Instructions (Signed)
Health Maintenance, Female Adopting a healthy lifestyle and getting preventive care are important in promoting health and wellness. Ask your health care provider about: The right schedule for you to have regular tests and exams. Things you can do on your own to prevent diseases and keep yourself healthy. What should I know about diet, weight, and exercise? Eat a healthy diet  Eat a diet that includes plenty of vegetables, fruits, low-fat dairy products, and lean protein. Do not eat a lot of foods that are high in solid fats, added sugars, or sodium. Maintain a healthy weight Body mass index (BMI) is used to identify weight problems. It estimates body fat based on height and weight. Your health care provider can help determine your BMI and help you achieve or maintain a healthy weight. Get regular exercise Get regular exercise. This is one of the most important things you can do for your health. Most adults should: Exercise for at least 150 minutes each week. The exercise should increase your heart rate and make you sweat (moderate-intensity exercise). Do strengthening exercises at least twice a week. This is in addition to the moderate-intensity exercise. Spend less time sitting. Even light physical activity can be beneficial. Watch cholesterol and blood lipids Have your blood tested for lipids and cholesterol at 47 years of age, then have this test every 5 years. Have your cholesterol levels checked more often if: Your lipid or cholesterol levels are high. You are older than 47 years of age. You are at high risk for heart disease. What should I know about cancer screening? Depending on your health history and family history, you may need to have cancer screening at various ages. This may include screening for: Breast cancer. Cervical cancer. Colorectal cancer. Skin cancer. Lung cancer. What should I know about heart disease, diabetes, and high blood pressure? Blood pressure and heart  disease High blood pressure causes heart disease and increases the risk of stroke. This is more likely to develop in people who have high blood pressure readings, are of African descent, or are overweight. Have your blood pressure checked: Every 3-5 years if you are 18-39 years of age. Every year if you are 40 years old or older. Diabetes Have regular diabetes screenings. This checks your fasting blood sugar level. Have the screening done: Once every three years after age 40 if you are at a normal weight and have a low risk for diabetes. More often and at a younger age if you are overweight or have a high risk for diabetes. What should I know about preventing infection? Hepatitis B If you have a higher risk for hepatitis B, you should be screened for this virus. Talk with your health care provider to find out if you are at risk for hepatitis B infection. Hepatitis C Testing is recommended for: Everyone born from 1945 through 1965. Anyone with known risk factors for hepatitis C. Sexually transmitted infections (STIs) Get screened for STIs, including gonorrhea and chlamydia, if: You are sexually active and are younger than 47 years of age. You are older than 47 years of age and your health care provider tells you that you are at risk for this type of infection. Your sexual activity has changed since you were last screened, and you are at increased risk for chlamydia or gonorrhea. Ask your health care provider if you are at risk. Ask your health care provider about whether you are at high risk for HIV. Your health care provider may recommend a prescription medicine   to help prevent HIV infection. If you choose to take medicine to prevent HIV, you should first get tested for HIV. You should then be tested every 3 months for as long as you are taking the medicine. Pregnancy If you are about to stop having your period (premenopausal) and you may become pregnant, seek counseling before you get  pregnant. Take 400 to 800 micrograms (mcg) of folic acid every day if you become pregnant. Ask for birth control (contraception) if you want to prevent pregnancy. Osteoporosis and menopause Osteoporosis is a disease in which the bones lose minerals and strength with aging. This can result in bone fractures. If you are 65 years old or older, or if you are at risk for osteoporosis and fractures, ask your health care provider if you should: Be screened for bone loss. Take a calcium or vitamin D supplement to lower your risk of fractures. Be given hormone replacement therapy (HRT) to treat symptoms of menopause. Follow these instructions at home: Lifestyle Do not use any products that contain nicotine or tobacco, such as cigarettes, e-cigarettes, and chewing tobacco. If you need help quitting, ask your health care provider. Do not use street drugs. Do not share needles. Ask your health care provider for help if you need support or information about quitting drugs. Alcohol use Do not drink alcohol if: Your health care provider tells you not to drink. You are pregnant, may be pregnant, or are planning to become pregnant. If you drink alcohol: Limit how much you use to 0-1 drink a day. Limit intake if you are breastfeeding. Be aware of how much alcohol is in your drink. In the [place] one drink equals one 12 oz bottle of beer (355 mL), one 5 oz glass of wine (148 mL), or one 1 oz glass of hard liquor (44 mL). General instructions Schedule regular health, dental, and eye exams. Stay current with your vaccines. Tell your health care provider if: You often feel depressed. You have ever been abused or do not feel safe at home. Summary Adopting a healthy lifestyle and getting preventive care are important in promoting health and wellness. Follow your health care provider's instructions about healthy diet, exercising, and getting tested or screened for diseases. Follow your health care provider's  instructions on monitoring your cholesterol and blood pressure. This information is not intended to replace advice given to you by your health care provider. Make sure you discuss any questions you have with your health care provider. Document Revised: 05/13/2020 Document Reviewed: 02/26/2018 Elsevier Patient Education  2022 Elsevier Inc.  

## 2020-11-24 NOTE — Progress Notes (Signed)
Patient was not seen no show

## 2021-02-20 ENCOUNTER — Emergency Department (HOSPITAL_BASED_OUTPATIENT_CLINIC_OR_DEPARTMENT_OTHER)
Admission: EM | Admit: 2021-02-20 | Discharge: 2021-02-20 | Disposition: A | Discharge status: Home / Self Care | Payer: 59 | Attending: Emergency Medicine | Admitting: Emergency Medicine

## 2021-02-20 ENCOUNTER — Encounter (HOSPITAL_BASED_OUTPATIENT_CLINIC_OR_DEPARTMENT_OTHER): Payer: Self-pay | Admitting: Emergency Medicine

## 2021-02-20 ENCOUNTER — Other Ambulatory Visit: Payer: Self-pay

## 2021-02-20 DIAGNOSIS — S61211A Laceration without foreign body of left index finger without damage to nail, initial encounter: Secondary | ICD-10-CM | POA: Insufficient documentation

## 2021-02-20 DIAGNOSIS — W272XXA Contact with scissors, initial encounter: Secondary | ICD-10-CM | POA: Insufficient documentation

## 2021-02-20 DIAGNOSIS — F17210 Nicotine dependence, cigarettes, uncomplicated: Secondary | ICD-10-CM | POA: Diagnosis not present

## 2021-02-20 DIAGNOSIS — S6992XA Unspecified injury of left wrist, hand and finger(s), initial encounter: Secondary | ICD-10-CM | POA: Diagnosis present

## 2021-02-20 MED ORDER — IBUPROFEN 800 MG PO TABS
800.0000 mg | ORAL_TABLET | Freq: Once | ORAL | Status: AC
  Administered 2021-02-20: 800 mg via ORAL
  Filled 2021-02-20: qty 1

## 2021-02-20 MED ORDER — LIDOCAINE HCL 2 % IJ SOLN
15.0000 mL | Freq: Once | INTRAMUSCULAR | Status: AC
  Administered 2021-02-20: 300 mg
  Filled 2021-02-20: qty 20

## 2021-02-20 NOTE — ED Provider Notes (Signed)
[facility] EMERGENCY DEPT Provider Note   CSN: [id number] Arrival date & time: 02/20/21  1608     History Chief Complaint  Patient presents with   Finger Injury    Shelby Case is a 45 y.o. female who presents the emergency department with a left second finger laceration that occurred earlier this morning.  Patient states that she was trying to open a plastic container with a pair scissors when she sliced her finger.  Pain is been constant since onset.  Bleeding was controlled prior to arrival with direct pressure and a wrap.  Rates her finger pain moderate severity.  HPI     Past Medical History:  Diagnosis Date   Acute blood loss anemia 01/03/2013   Medical history non-contributory    Metrorrhagia 01/03/2013    Patient Active Problem List   Diagnosis Date Noted   Metrorrhagia 01/03/2013   Acute blood loss anemia 01/03/2013   LIPOMA 05/16/2006   OBESITY, NOS 05/16/2006   TOBACCO DEPENDENCE 05/16/2006   ACNE 05/16/2006    History reviewed. No pertinent surgical history.   OB History     Gravida  3   Para  1   Term      Preterm      AB      Living  1      SAB      IAB      Ectopic      Multiple      Live Births              Family History  Problem Relation Age of Onset   Irregular heart beat Father    Hypertension Brother     Social History   Tobacco Use   Smoking status: Light Smoker    Types: Cigarettes    Last attempt to quit: 12/27/2012    Years since quitting: 8.1   Smokeless tobacco: Never   Tobacco comments:    occassionally will smoke Black and Mild   Substance Use Topics   Alcohol use: No   Drug use: No    Home Medications Prior to Admission medications   Medication Sig Start Date End Date Taking? Authorizing Provider  mupirocin ointment (BACTROBAN) 2 % APPLY A SMALL AMOUNT TOPICALLY TO AFFECTED AREA 3 TIMES A DAY 10/05/19   Minette Brine, FNP  Semaglutide (RYBELSUS) 14 MG TABS Take 1 tablet by  mouth daily. 05/18/20   Minette Brine, FNP  vitamin C (ASCORBIC ACID) 500 MG tablet Take 500 mg by mouth daily.    [provider]  VITAMIN D PO Take 1 tablet by mouth daily.    [provider]    Allergies    Penicillins  Review of Systems   Review of Systems  All other systems reviewed and are negative.  Physical Exam Updated Vital Signs BP (!) 146/91 (BP Location: Right Arm)   Pulse 91   Temp 98.4 F (36.9 C) (Temporal)   Resp 16   Ht 5\' 5"  (1.651 m)   Wt 81.6 kg   SpO2 99%   BMI 29.95 kg/m   Physical Exam Vitals and nursing note reviewed.  Constitutional:      Appearance: Normal appearance.  HENT:     Head: Normocephalic and atraumatic.  Eyes:     General:        Right eye: No discharge.        Left eye: No discharge.     Conjunctiva/sclera: Conjunctivae normal.  Pulmonary:  Effort: Pulmonary effort is normal.  Skin:    General: Skin is warm and dry.     Findings: No rash.     Comments: 3 cm linear laceration to the second left finger.  Neurological:     General: No focal deficit present.     Mental Status: She is alert.  Psychiatric:        Mood and Affect: Mood normal.        Behavior: Behavior normal.    ED Results / Procedures / Treatments   Labs (all labs ordered are listed, but only abnormal results are displayed) Labs Reviewed - No data to display  EKG None  Radiology No results found.  Procedures .Marland KitchenLaceration Repair  Date/Time: 02/20/2021 7:54 PM Performed by: Hendricks Limes, PA-C Authorized by: Hendricks Limes, PA-C   Consent:    Consent obtained:  Verbal   Consent given by:  Patient   Risks, benefits, and alternatives were discussed: yes     Risks discussed:  Infection, pain and need for additional repair   Alternatives discussed:  No treatment Universal protocol:    Procedure explained and questions answered to patient or proxy's satisfaction: yes     Relevant documents present and verified: yes      Test results available: yes     Imaging studies available: no     Required blood products, implants, devices, and special equipment available: no     Site/side marked: no     Immediately prior to procedure, a time out was called: no     Patient identity confirmed:  Verbally with patient and arm band Anesthesia:    Anesthesia method:  Local infiltration Laceration details:    Location:  Finger   Finger location:  L index finger   Length (cm):  3   Depth (mm):  2 Pre-procedure details:    Preparation:  Patient was prepped and draped in usual sterile fashion Exploration:    Limited defect created (wound extended): no     Hemostasis achieved with:  Tourniquet and direct pressure   Wound exploration: wound explored through full range of motion and entire depth of wound visualized     Wound extent: no areolar tissue violation noted, no fascia violation noted, no foreign bodies/material noted, no muscle damage noted, no nerve damage noted, no tendon damage noted and no underlying fracture noted   Treatment:    Area cleansed with:  Chlorhexidine and saline   Amount of cleaning:  Standard   Irrigation solution:  Sterile saline   Irrigation volume:  .5 L   Irrigation method:  Pressure wash   Visualized foreign bodies/material removed: no     Debridement:  None   Undermining:  None   Scar revision: no   Skin repair:    Repair method:  Sutures   Suture size:  4-0   Suture material:  Prolene   Suture technique:  Simple interrupted   Number of sutures:  3 Repair type:    Repair type:  Simple Post-procedure details:    Dressing:  Bulky dressing   Procedure completion:  Tolerated well, no immediate complications   Medications Ordered in ED Medications  lidocaine (XYLOCAINE) 2 % (with pres) injection 300 mg (300 mg Infiltration Given by Other 02/20/21 1912)  ibuprofen (ADVIL) tablet 800 mg (800 mg Oral Given 02/20/21 1941)    ED Course  I have reviewed the triage vital signs and the  nursing notes.  Pertinent labs & imaging results that were  available during my care of the patient were reviewed by me and considered in my medical decision making (see chart for details).    MDM Rules/Calculators/A&P                          Shelby Case is a 45 y.o. female who presents the emergency department with a finger laceration.  Laceration is very superficial does not appear to enter significantly into the dermis or subcutaneous tissue.  Will likely have to repair with sutures.  I repaired the laceration.  Please see procedure note above.  There was evidence of bleeding after suture closure.  Bleeding stopped with direct pressure.  Pain was controlled with 800 mg of ibuprofen.  I will have her return to any of the [facility] facilities or her primary care doctor in 7 to 10 days for suture removal.   Final Clinical Impression(s) / ED Diagnoses Final diagnoses:  Laceration of left index finger without foreign body without damage to nail, initial encounter    Rx / DC Orders ED Discharge Orders     None        Cherrie Gauze 02/20/21 2010    Truddie Hidden, MD 02/21/21 1500

## 2021-02-20 NOTE — ED Triage Notes (Signed)
Pt arrives to ED with c/o finger injury. Pt reports she cut her left index finger with a pair of scissors.

## 2021-02-20 NOTE — Discharge Instructions (Addendum)
I closed your laceration with 2 sutures.  Please take 600 mg of ibuprofen every 6 hours for pain control.  I would like for you to return to this emergency department, your primary care doctor, or any other Facilities in 7 to 10 days for suture removal.  Please return to the emergency department sooner if you experience fevers, purulent drainage, streaking redness, increased level of pain, or any other concerns you might have.

## 2021-02-22 ENCOUNTER — Telehealth: Payer: Self-pay

## 2021-02-22 NOTE — Telephone Encounter (Signed)
Left pt vm to give the office a call back for suture removal.

## 2021-03-16 LAB — HM PAP SMEAR: HM Pap smear: NORMAL

## 2021-04-14 ENCOUNTER — Other Ambulatory Visit: Payer: Self-pay | Admitting: Endocrinology

## 2021-04-14 DIAGNOSIS — E221 Hyperprolactinemia: Secondary | ICD-10-CM

## 2021-04-26 ENCOUNTER — Ambulatory Visit
Admission: RE | Admit: 2021-04-26 | Discharge: 2021-04-26 | Disposition: A | Discharge status: Home / Self Care | Payer: 59 | Source: Ambulatory Visit | Attending: Endocrinology | Admitting: Endocrinology

## 2021-04-26 ENCOUNTER — Other Ambulatory Visit: Payer: Self-pay

## 2021-04-26 DIAGNOSIS — E221 Hyperprolactinemia: Secondary | ICD-10-CM

## 2021-04-26 MED ORDER — GADOBENATE DIMEGLUMINE 529 MG/ML IV SOLN
9.0000 mL | Freq: Once | INTRAVENOUS | Status: AC | PRN
  Administered 2021-04-26: 9 mL via INTRAVENOUS

## 2021-05-11 LAB — HM MAMMOGRAPHY: HM Mammogram: NORMAL (ref 0–4)

## 2021-08-16 ENCOUNTER — Encounter: Payer: Self-pay | Admitting: Nurse Practitioner

## 2021-08-16 ENCOUNTER — Ambulatory Visit (INDEPENDENT_AMBULATORY_CARE_PROVIDER_SITE_OTHER): Payer: Commercial Managed Care - PPO | Admitting: Nurse Practitioner

## 2021-08-16 VITALS — BP 128/70 | HR 93 | Temp 98.6°F | Ht 65.0 in | Wt 185.0 lb

## 2021-08-16 DIAGNOSIS — F418 Other specified anxiety disorders: Secondary | ICD-10-CM

## 2021-08-16 MED ORDER — HYDROXYZINE HCL 10 MG PO TABS: 10.0000 mg | ORAL_TABLET | Freq: Three times a day (TID) | ORAL | 1 refills | Status: DC | PRN

## 2021-08-16 MED ORDER — ESCITALOPRAM OXALATE 10 MG PO TABS: 10.0000 mg | ORAL_TABLET | Freq: Every day | ORAL | 2 refills | Status: DC

## 2021-08-16 NOTE — Patient Instructions (Signed)
Managing Stress, Adult Feeling a certain amount of stress is normal. Stress helps our body and mind get ready to deal with the demands of life. Stress hormones can motivate you to do well at work and meet your responsibilities. But severe or long-term (chronic) stress can affect your mental and physical health. Chronic stress puts you at higher risk for: Anxiety and depression. Other health problems such as digestive problems, muscle aches, heart disease, high blood pressure, and stroke. What are the causes? Common causes of stress include: Demands from work, such as deadlines, feeling overworked, or having long hours. Pressures at home, such as money issues, disagreements with a spouse, or parenting issues. Pressures from major life changes, such as divorce, moving, loss of a loved one, or chronic illness. You may be at higher risk for stress-related problems if you: Do not get enough sleep. Are in poor health. Do not have emotional support. Have a mental health disorder such as anxiety or depression. How to recognize stress Stress can make you: Have trouble sleeping. Feel sad, anxious, irritable, or overwhelmed. Lose your appetite. Overeat or want to eat unhealthy foods. Want to use drugs or alcohol. Stress can also cause physical symptoms, such as: Sore, tense muscles, especially in the shoulders and neck. Headaches. Trouble breathing. A faster heart rate. Stomach pain, nausea, or vomiting. Diarrhea or constipation. Trouble concentrating. Follow these instructions at home: Eating and drinking Eat a healthy diet. This includes: Eating foods that are high in fiber, such as beans, whole grains, and fresh fruits and vegetables. Limiting foods that are high in fat and processed sugars, such as fried or sweet foods. Do not skip meals or overeat. Drink enough fluid to keep your urine pale yellow. Alcohol use Do not drink alcohol if: Your health care provider tells you not to  drink. You are pregnant, may be pregnant, or are planning to become pregnant. Drinking alcohol is a way some people try to ease their stress. This can be dangerous, so if you drink alcohol: Limit how much you have to: 0-1 drink a day for women. 0-2 drinks a day for men. Know how much alcohol is in your drink. In the [place] one drink equals one 12 oz bottle of beer (355 mL), one 5 oz glass of wine (148 mL), or one 1 oz glass of hard liquor (44 mL). Activity  Include 30 minutes of exercise in your daily schedule. Exercise is a good stress reducer. Include time in your day for an activity that you find relaxing. Try taking a walk, going on a bike ride, reading a book, or listening to music. Schedule your time in a way that lowers stress, and keep a regular schedule. Focus on doing what is most important to get done. Lifestyle Identify the source of your stress and your reaction to it. See a therapist who can help you change unhelpful reactions. When there are stressful events: Talk about them with family, friends, or coworkers. Try to think realistically about stressful events and not ignore them or overreact. Try to find the positives in a stressful situation and not focus on the negatives. Cut back on responsibilities at work and home, if possible. Ask for help from friends or family members if you need it. Find ways to manage stress, such as: Mindfulness, meditation, or deep breathing. Yoga or tai chi. Progressive muscle relaxation. Spending time in nature. Doing art, playing music, or reading. Making time for fun activities. Spending time with family and friends. Get support  from family, friends, or spiritual resources. General instructions Get enough sleep. Try to go to sleep and get up at about the same time every day. Take over-the-counter and prescription medicines only as told by your health care provider. Do not use any products that contain nicotine or tobacco. These products  include cigarettes, chewing tobacco, and vaping devices, such as e-cigarettes. If you need help quitting, ask your health care provider. Do not use drugs or smoke to deal with stress. Keep all follow-up visits. This is important. Where to find support Talk with your health care provider about stress management or finding a support group. Find a therapist to work with you on your stress management techniques. Where to find more information [employer] on Mental Illness: www.nami.org American Psychological Association: TVStere[employer].ch Contact a health care provider if: Your stress symptoms get worse. You are unable to manage your stress at home. You are struggling to stop using drugs or alcohol. Get help right away if: You may be a danger to yourself or others. You have any thoughts of death or suicide. Get help right awayif you feel like you may hurt yourself or others, or have thoughts about taking your own life. Go to your nearest emergency room or: Call 911. Call the [facility] at 289-837-1647 or 988 in the [place] This is open 24 hours a day. Text the Crisis Text Line at (726)508-3618. Summary Feeling a certain amount of stress is normal, but severe or long-term (chronic) stress can affect your mental and physical health. Chronic stress can put you at higher risk for anxiety, depression, and other health problems such as digestive problems, muscle aches, heart disease, high blood pressure, and stroke. You may be at higher risk for stress-related problems if you do not get enough sleep, are in poor health, lack emotional support, or have a mental health disorder such as anxiety or depression. Identify the source of your stress and your reaction to it. Try talking about stressful events with family, friends, or coworkers, finding a coping method, or getting support from spiritual resources. If you need more help, talk with your health care provider about finding a  support group or a mental health therapist. This information is not intended to replace advice given to you by your health care provider. Make sure you discuss any questions you have with your health care provider. Document Revised: 09/29/2020 Document Reviewed: 09/27/2020 Elsevier Patient Education  [place].

## 2021-08-16 NOTE — Progress Notes (Signed)
I,Tianna Badgett,acting as a Education administrator for [employer], FNP.,have documented all relevant documentation on the behalf of Minette Brine, FNP,as directed by  Minette Brine, FNP while in the presence of Minette Brine, [place].  This visit occurred during the SARS-CoV-2 public health emergency.  Safety protocols were in place, including screening questions prior to the visit, additional usage of staff PPE, and extensive cleaning of exam room while observing appropriate contact time as indicated for disinfecting solutions.  Subjective:     Patient ID: Shelby Case , female    DOB: [birth year] , 46 y.o.   MRN: [id number]   Chief Complaint  Patient presents with   Stress    HPI  Patient presents today for stress. Reports she is having difficulties at work and home. She is always upset. Appetite is decreased. She is having difficulty concentrating. She is having anxiety. She manages an apartment complex.   She has been trying to help her daughter with school. She is having marital problems. 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 She is not sleeping well will dose off but will be up at 330am. She has been trying to help her daughter who has been having problems with her car. She is not wanting to be around people.    She went to Dr [facility]win [facility] for a specialist. She is having a problem with her pitutary gland and taking a medication once a week. She had a cycle in March. She has been to counseling in the past but did not have a good relationship with the counselor, this was within the last year. She feels safe at home. She has never taken any medications for anxiety or depression. Denies suicidal or homicidal ideations    Past Medical History:  Diagnosis Date   Acute blood loss anemia 01/03/2013   Medical history non-contributory    Metrorrhagia 01/03/2013     Family History  Problem Relation Age of Onset   Irregular heart beat Father    Hypertension Brother      Current Outpatient  Medications:    escitalopram (LEXAPRO) 10 MG tablet, Take 1 tablet (10 mg total) by mouth daily., Disp: 30 tablet, Rfl: 2   hydrOXYzine (ATARAX) 10 MG tablet, Take 1 tablet (10 mg total) by mouth 3 (three) times daily as needed., Disp: 30 tablet, Rfl: 1   Allergies  Allergen Reactions   Penicillins Other (See Comments)    Childhood reaction      Review of Systems  Constitutional: Negative.   Respiratory: Negative.    Cardiovascular: Negative.   Gastrointestinal: Negative.   Neurological: Negative.   Psychiatric/Behavioral:  Positive for dysphoric mood. The patient is nervous/anxious.     Today's Vitals   08/16/21 0925  BP: 128/70  Pulse: 93  Temp: 98.6 F (37 C)  TempSrc: Oral  Weight: 185 lb (83.9 kg)  Height: '5\' 5"'$  (1.651 m)   Body mass index is 30.79 kg/m.  Wt Readings from Last 3 Encounters:  08/16/21 185 lb (83.9 kg)  02/20/21 180 lb (81.6 kg)  05/18/20 188 lb 9.6 oz (85.5 kg)    Objective:  Physical Exam Vitals reviewed.  Constitutional:      General: She is not in acute distress.    Appearance: Normal appearance.  Cardiovascular:     Rate and Rhythm: Normal rate and regular rhythm.     Pulses: Normal pulses.     Heart sounds: Normal heart sounds. No murmur heard. Pulmonary:     Effort: No  respiratory distress.     Breath sounds: No wheezing.  Neurological:     General: No focal deficit present.     Mental Status: She is alert and oriented to person, place, and time.     Cranial Nerves: No cranial nerve deficit.     Motor: No weakness.  Psychiatric:        Mood and Affect: Mood normal.        Behavior: Behavior normal.        Thought Content: Thought content normal.        Judgment: Judgment normal.        Assessment And Plan:     1. Depression with anxiety Comments: She is tearful during visit, will start on Lexapro and atarax as needed. Will refer to counseling. Also recommended she check with her job for EAP assistance to get her started.  Also will take out of work for 1 week and re-evaluate in one week. Depression screen score 17 and GAD7 is 15.  - escitalopram (LEXAPRO) 10 MG tablet; Take 1 tablet (10 mg total) by mouth daily.  Dispense: 30 tablet; Refill: 2 - hydrOXYzine (ATARAX) 10 MG tablet; Take 1 tablet (10 mg total) by mouth 3 (three) times daily as needed.  Dispense: 30 tablet; Refill: 1 - Ambulatory referral to Psychology    Patient was given opportunity to ask questions. Patient verbalized understanding of the plan and was able to repeat key elements of the plan. All questions were answered to their satisfaction.  Minette Brine, FNP   I, Minette Brine, FNP, have reviewed all documentation for this visit. The documentation on 08/16/21 for the exam, diagnosis, procedures, and orders are all accurate and complete.   IF YOU HAVE BEEN REFERRED TO A SPECIALIST, IT MAY TAKE 1-2 WEEKS TO SCHEDULE/PROCESS THE REFERRAL. IF YOU HAVE NOT HEARD FROM US/SPECIALIST IN TWO WEEKS, PLEASE GIVE [place] A CALL AT [phone] X [phone].   THE PATIENT IS ENCOURAGED TO PRACTICE SOCIAL DISTANCING DUE TO THE COVID-19 PANDEMIC.

## 2021-08-22 ENCOUNTER — Encounter: Payer: Self-pay | Admitting: Nurse Practitioner

## 2021-08-22 ENCOUNTER — Ambulatory Visit (INDEPENDENT_AMBULATORY_CARE_PROVIDER_SITE_OTHER): Payer: Commercial Managed Care - PPO | Admitting: Nurse Practitioner

## 2021-08-22 VITALS — BP 122/84 | HR 84 | Temp 98.1°F | Ht 65.0 in | Wt 184.2 lb

## 2021-08-22 DIAGNOSIS — Z6830 Body mass index (BMI) 30.0-30.9, adult: Secondary | ICD-10-CM

## 2021-08-22 DIAGNOSIS — F418 Other specified anxiety disorders: Secondary | ICD-10-CM

## 2021-08-22 NOTE — Patient Instructions (Addendum)
Depression Screening Depression screening is a tool that your health care provider can use to learn if you have symptoms of depression. Depression is a common condition with many symptoms that are also often found in other conditions. Depression is treatable, but it must first be diagnosed. You may not know that certain feelings, thoughts, and behaviors that you are having can be symptoms of depression. Taking a depression screening test can help you and your health care provider decide if you need more assessment, or if you should be referred to a mental health care provider. What are the screening tests? You may have a physical exam to see if another condition is affecting your mental health. You may have a blood or urine sample taken during the physical exam. You may be interviewed or offered a written test using a screening tool that was developed from research, such as one of these: Patient Health Questionnaire (PHQ). This is a set of either 2 or 9 questions. A health care provider who has been trained to score this screening test uses a guide to assess if your symptoms suggest that you may have depression. Hamilton Depression Rating Scale (HAM-D). This is a set of either 17 or 24 questions. You may be asked to take it again during or after your treatment, to see if your depression has gotten better. Beck Depression Inventory (BDI). This is a set of 21 multiple choice questions. Your health care provider scores your answers to assess: Your level of depression, ranging from mild to severe. Your response to treatment. Your health care provider may talk with you about your daily activities, such as eating, sleeping, work, and recreation, and ask if you have had any changes in activity. Your health care provider may ask you to see a mental health specialist, such as a psychiatrist or psychologist, for more evaluation. Who should be screened for depression?  All adults, including adults with a family  history of a mental health disorder. People who are 10-21 years old. People who are recovering from an acute condition, such as myocardial infarction (MI) or stroke. Pregnant women, or women who have given birth. People who have a long-term (chronic) illness. Anyone who has been diagnosed with another type of mental health disorder. Anyone who has symptoms that could show depression. What do my results mean? Your health care provider will review the results of your depression screening, physical exam, and lab tests. Positive screens suggest that you may have depression. Screening is the first step in getting the care that you may need. It will be important for you to know the results of your tests. Ask your health care provider, or the department that is doing your screening tests, when your results will be ready. Talk with your health care provider about your results, diagnosis, and recommendations for follow-up. A diagnosis of depression is made using information from the Diagnostic and Statistical Manual of Mental Disorders (DSM-5). This is a book that lists the number and type of symptoms that must be present for a health care provider to give a specific diagnosis. Your health care provider may work with you to treat your symptoms of depression, or your health care provider may help you find a mental health provider who can assess and help you develop a plan to treat your depression. Get help right away if: You have thoughts about hurting yourself or others. If you ever feel like you may hurt yourself or others, or have thoughts about taking your own life,   get help right away. Go to your nearest emergency department or: Call your local emergency services (911 in the [place] Call a suicide crisis helpline, such as the [facility] at 873-267-1139 or 988 in the [place]. This is open 24 hours a day in the [place] Text the Crisis Text Line at (502)731-4425 (in the  [place].). Summary Depression screening is the first step in getting the help that you may need. If your screening test shows symptoms of depression (is positive), your health care provider may ask you to see a mental health provider who will help identify ways to treat your depression. Anyone aged 47 or older should be screened for depression. This information is not intended to replace advice given to you by your health care provider. Make sure you discuss any questions you have with your health care provider. Document Revised: 09/28/2020 Document Reviewed: 06/13/2020 Elsevier Patient Education  [place]   [phone] - this is the counseling service call to schedule an appt.

## 2021-08-22 NOTE — Progress Notes (Signed)
I,Victoria T [place],acting as a Education administrator for Minette Brine, FNP.,have documented all relevant documentation on the behalf of Minette Brine, FNP,as directed by  Minette Brine, FNP while in the presence of Minette Brine, [place].   This visit occurred during the SARS-CoV-2 public health emergency.  Safety protocols were in place, including screening questions prior to the visit, additional usage of staff PPE, and extensive cleaning of exam room while observing appropriate contact time as indicated for disinfecting solutions.  Subjective:     Patient ID: Shelby Case , female    DOB: 01/31/1976 , 46 y.o.   MRN: [id number]   Chief Complaint  Patient presents with   Stress    HPI  Patient presents today for med f/u. She reports she feels a little better. She reports medications working. She is taking atarax 3 times a day. Does not make her sleepy.     Past Medical History:  Diagnosis Date   Acute blood loss anemia 01/03/2013   Medical history non-contributory    Metrorrhagia 01/03/2013     Family History  Problem Relation Age of Onset   Irregular heart beat Father    Hypertension Brother      Current Outpatient Medications:    escitalopram (LEXAPRO) 10 MG tablet, Take 1 tablet (10 mg total) by mouth daily., Disp: 30 tablet, Rfl: 2   hydrOXYzine (ATARAX) 10 MG tablet, Take 1 tablet (10 mg total) by mouth 3 (three) times daily as needed., Disp: 30 tablet, Rfl: 1   Allergies  Allergen Reactions   Penicillins Other (See Comments)    Childhood reaction      Review of Systems  Constitutional: Negative.   Respiratory: Negative.    Cardiovascular: Negative.   Neurological: Negative.   Psychiatric/Behavioral: Negative.      Today's Vitals   08/22/21 1228  BP: 122/84  Pulse: 84  Temp: 98.1 F (36.7 C)  SpO2: 99%  Weight: 184 lb 3.2 oz (83.6 kg)  Height: '5\' 5"'$  (1.651 m)  PainSc: 0-No pain   Body mass index is 30.65 kg/m.  Wt Readings from Last 3 Encounters:  08/22/21  184 lb 3.2 oz (83.6 kg)  08/16/21 185 lb (83.9 kg)  02/20/21 180 lb (81.6 kg)    Objective:  Physical Exam Vitals reviewed.  Constitutional:      General: She is not in acute distress.    Appearance: Normal appearance. She is obese.  Pulmonary:     Effort: Pulmonary effort is normal. No respiratory distress.  Neurological:     General: No focal deficit present.     Mental Status: She is alert and oriented to person, place, and time.     Cranial Nerves: No cranial nerve deficit.     Motor: No weakness.  Psychiatric:        Behavior: Behavior normal.        Thought Content: Thought content normal.        Judgment: Judgment normal.     Comments: Flat, she is not crying today        Assessment And Plan:     1. Depression with anxiety Comments: She is feeling better and tolerating medications well. She seems to be in a better mood, still has a flat affect but not crying. I will take her out of work until June 12th. Phone number given for Counseling.   2. BMI 30.0-30.9,adult     Patient was given opportunity to ask questions. Patient verbalized understanding of the plan and was able  to repeat key elements of the plan. All questions were answered to their satisfaction.  Minette Brine, FNP   I, Minette Brine, FNP, have reviewed all documentation for this visit. The documentation on 08/22/21 for the exam, diagnosis, procedures, and orders are all accurate and complete.   IF YOU HAVE BEEN REFERRED TO A SPECIALIST, IT MAY TAKE 1-2 WEEKS TO SCHEDULE/PROCESS THE REFERRAL. IF YOU HAVE NOT HEARD FROM US/SPECIALIST IN TWO WEEKS, PLEASE GIVE [place] A CALL AT [phone].   THE PATIENT IS ENCOURAGED TO PRACTICE SOCIAL DISTANCING DUE TO THE COVID-19 PANDEMIC.

## 2021-09-24 ENCOUNTER — Other Ambulatory Visit: Payer: Self-pay | Admitting: Nurse Practitioner

## 2021-09-24 DIAGNOSIS — F418 Other specified anxiety disorders: Secondary | ICD-10-CM

## 2021-11-09 ENCOUNTER — Other Ambulatory Visit: Payer: Self-pay | Admitting: Nurse Practitioner

## 2021-11-09 DIAGNOSIS — F418 Other specified anxiety disorders: Secondary | ICD-10-CM

## 2021-11-24 ENCOUNTER — Other Ambulatory Visit: Payer: Self-pay | Admitting: Nurse Practitioner

## 2021-11-24 DIAGNOSIS — F418 Other specified anxiety disorders: Secondary | ICD-10-CM

## 2021-12-05 DIAGNOSIS — H00021 Hordeolum internum right upper eyelid: Secondary | ICD-10-CM | POA: Diagnosis not present

## 2021-12-06 ENCOUNTER — Encounter: Payer: Commercial Managed Care - PPO | Admitting: Nurse Practitioner

## 2021-12-08 ENCOUNTER — Encounter: Payer: Self-pay | Admitting: Nurse Practitioner

## 2021-12-12 ENCOUNTER — Encounter: Payer: Commercial Managed Care - PPO | Admitting: Nurse Practitioner

## 2021-12-12 DIAGNOSIS — H00021 Hordeolum internum right upper eyelid: Secondary | ICD-10-CM | POA: Diagnosis not present

## 2021-12-28 DIAGNOSIS — Z1159 Encounter for screening for other viral diseases: Secondary | ICD-10-CM | POA: Diagnosis not present

## 2021-12-28 DIAGNOSIS — N898 Other specified noninflammatory disorders of vagina: Secondary | ICD-10-CM | POA: Diagnosis not present

## 2021-12-28 DIAGNOSIS — Z114 Encounter for screening for human immunodeficiency virus [HIV]: Secondary | ICD-10-CM | POA: Diagnosis not present

## 2021-12-28 DIAGNOSIS — Z113 Encounter for screening for infections with a predominantly sexual mode of transmission: Secondary | ICD-10-CM | POA: Diagnosis not present

## 2022-02-28 ENCOUNTER — Ambulatory Visit (INDEPENDENT_AMBULATORY_CARE_PROVIDER_SITE_OTHER): Payer: Commercial Managed Care - PPO | Admitting: Nurse Practitioner

## 2022-02-28 ENCOUNTER — Encounter: Payer: Self-pay | Admitting: Nurse Practitioner

## 2022-02-28 ENCOUNTER — Other Ambulatory Visit: Payer: Self-pay | Admitting: Nurse Practitioner

## 2022-02-28 VITALS — BP 128/70 | HR 84 | Temp 98.6°F | Ht 65.0 in | Wt 203.0 lb

## 2022-02-28 DIAGNOSIS — F418 Other specified anxiety disorders: Secondary | ICD-10-CM

## 2022-02-28 DIAGNOSIS — Z2821 Immunization not carried out because of patient refusal: Secondary | ICD-10-CM

## 2022-02-28 DIAGNOSIS — Z28310 Unvaccinated for covid-19: Secondary | ICD-10-CM

## 2022-02-28 DIAGNOSIS — E7800 Pure hypercholesterolemia, unspecified: Secondary | ICD-10-CM | POA: Diagnosis not present

## 2022-02-28 DIAGNOSIS — R7309 Other abnormal glucose: Secondary | ICD-10-CM

## 2022-02-28 NOTE — Patient Instructions (Signed)
Preventing Type 2 Diabetes Mellitus Type 2 diabetes, also called type 2 diabetes mellitus, is a long-term (chronic) disease that affects sugar (glucose) levels in your blood. Normally, a hormone called insulin allows glucose to enter cells in your body. The cells use glucose for energy. With type 2 diabetes, you will have one or both of these problems: Your pancreas does not make enough insulin. Cells in your body do not respond properly to insulin that your body makes (insulin resistance). Insulin resistance or lack of insulin causes extra glucose to build up in the blood instead of going into cells. As a result, high blood glucose (hyperglycemia) develops. That can cause many complications. Being overweight or obese and having an inactive (sedentary) lifestyle can increase your risk for diabetes. Type 2 diabetes can be delayed or prevented by making certain nutrition and lifestyle changes. How can this condition affect me? If you do not take steps to prevent diabetes, your blood glucose levels may keep increasing over time. Too much glucose in your blood for a long time can damage your blood vessels, heart, kidneys, nerves, and eyes. Type 2 diabetes can lead to chronic health problems and complications, such as: Heart disease. Stroke. Blindness. Kidney disease. Depression. Poor circulation in your feet and legs. In severe cases, a foot or leg may need to be surgically removed (amputated). What can increase my risk? You may be more likely to develop type 2 diabetes if you: Have type 2 diabetes in your family. Are overweight or obese. Have a sedentary lifestyle. Have insulin resistance or a history of prediabetes. Have a history of pregnancy-related (gestational) diabetes or polycystic ovary syndrome (PCOS). What actions can I take to prevent this? It can be difficult to recognize signs of type 2 diabetes. Taking action to prevent the disease before you develop symptoms is the best way to avoid  possible damage to your body. Making certain nutrition and lifestyle changes may prevent or delay the disease and related health problems. Nutrition  Eat healthy meals and snacks regularly. Do not skip meals. Fruit or a handful of nuts is a healthy snack between meals. Drink water throughout the day. Avoid drinks that contain added sugar, such as soda or sweetened tea. Drink enough fluid to keep your urine pale yellow. Follow instructions from your health care provider about eating or drinking restrictions. Limit the amount of food you eat by: Managing how much you eat at a time (portion size). Checking food labels for the serving sizes of food. Using a kitchen scale to weigh amounts of food. Saut or steam food instead of frying it. Cook with water or broth instead of oils or butter. Limit saturated fat and salt (sodium) in your diet. Have no more than 1 tsp (2,400 mg) of sodium a day. If you have heart disease or high blood pressure, use less than ? tsp (1,500 mg) of sodium a day. Lifestyle  Lose weight if needed and as told. Your health care provider can determine how much weight loss is best for you and can help you lose weight safely. If you are overweight or obese, you may be told to lose at least 5?7% of your body weight. Manage blood pressure, cholesterol, and stress. Your health care provider will help determine the best treatment for you. Do not use any products that contain nicotine or tobacco. These products include cigarettes, chewing tobacco, and vaping devices, such as e-cigarettes. If you need help quitting, ask your health care provider. Activity  Do physical   activity that makes your heart beat faster and makes you sweat (moderate intensity). Do this for at least 30 minutes on at least 5 days of the week, or as much as told by your health care provider. Ask your health care provider what activities are safe for you. A mix of activities may be best, such as walking, swimming,  cycling, and strength training. Try to add physical activity into your day. For example: Park your car farther away than usual so that you walk more. Take a walk during your lunch break. Use stairs instead of elevators or escalators. Walk or bike to work instead of driving. Alcohol use If you drink alcohol: Limit how much you have to: 0?1 drink a day for women who are not pregnant. 0?2 drinks a day for men. Know how much alcohol is in your drink. In the [place] one drink equals one 12 oz bottle of beer (355 mL), one 5 oz glass of wine (148 mL), or one 1 oz glass of hard liquor (44 mL). General information Talk with your health care provider about your risk factors and how you can reduce your risk for diabetes. Have your blood glucose tested regularly, as told by your health care provider. Get screening tests as told by your health care provider. You may have these regularly, especially if you have certain risk factors for type 2 diabetes. Make an appointment with a registered dietitian. This diet and nutrition specialist can help you make a healthy eating plan and help you understand portion sizes and food labels. Where to find support Ask your health care provider to recommend a registered dietitian, a certified diabetes care and education specialist, or a weight loss program. Look for local or online weight loss groups. Join a gym, fitness club, or outdoor activity group, such as a walking club. Where to find more information For help and guidance and to learn more about diabetes and diabetes prevention, visit: American Diabetes Association (ADA): www.diabetes.org National Institute of Diabetes and Digestive and Kidney Diseases: www.niddk.nih.gov To learn more about healthy eating, visit: U.S. Department of Agriculture (USDA): www.choosemyplate.gov Office of Disease Prevention and Health Promotion (ODPHP): health.gov Summary You can delay or prevent type 2 diabetes by eating healthy  foods, losing weight if needed, and increasing your physical activity. Talk with your health care provider about your risk factors for type 2 diabetes and how you can reduce your risk. It can be difficult to recognize the signs of type 2 diabetes. The best way to avoid possible damage to your body is to take action to prevent the disease before you develop symptoms. Get screening tests as told by your health care provider. This information is not intended to replace advice given to you by your health care provider. Make sure you discuss any questions you have with your health care provider. Document Revised: 05/30/2020 Document Reviewed: 05/30/2020 Elsevier Patient Education  2023 Elsevier Inc.  

## 2022-02-28 NOTE — Progress Notes (Signed)
I,Tianna Badgett,acting as a Education administrator for [employer], FNP.,have documented all relevant documentation on the behalf of Minette Brine, FNP,as directed by  Minette Brine, FNP while in the presence of Minette Brine, [place].  Subjective:     Patient ID: Shelby Case , female    DOB: 16-Jan-1976 , 46 y.o.   MRN: [id number]   Chief Complaint  Patient presents with   Prediabetes    HPI  Patient presents today for a f/u on her abnormal glucose. She had not been taking Rybelsus. She is starting to feel "funny". She had been treated for a stye underneath her eye.   Wt Readings from Last 3 Encounters: 02/28/22 : 203 lb (92.1 kg) 08/22/21 : 184 lb 3.2 oz (83.6 kg) 08/16/21 : 185 lb (83.9 kg)       Past Medical History:  Diagnosis Date   Acute blood loss anemia 01/03/2013   Medical history non-contributory    Metrorrhagia 01/03/2013     Family History  Problem Relation Age of Onset   Irregular heart beat Father    Hypertension Brother      Current Outpatient Medications:    escitalopram (LEXAPRO) 10 MG tablet, TAKE 1 TABLET BY MOUTH EVERY DAY, Disp: 30 tablet, Rfl: 2   hydrOXYzine (ATARAX) 10 MG tablet, TAKE 1 TABLET BY MOUTH THREE TIMES A DAY AS NEEDED, Disp: 30 tablet, Rfl: 1   Allergies  Allergen Reactions   Penicillins Other (See Comments)    Childhood reaction      Review of Systems  Constitutional: Negative.   Respiratory: Negative.    Cardiovascular: Negative.   Gastrointestinal: Negative.   Endocrine: Positive for polyphagia (more in the middle of the night) and polyuria. Negative for polydipsia.  Neurological: Negative.   Psychiatric/Behavioral: Negative.       Today's Vitals   02/28/22 1147  BP: 128/70  Pulse: 84  Temp: 98.6 F (37 C)  TempSrc: Oral  Weight: 203 lb (92.1 kg)  Height: _0  (1.651 m)   Body mass index is 33.78 kg/m.   Objective:  Physical Exam Vitals reviewed.  Constitutional:      Appearance: Normal appearance.   Cardiovascular:     Rate and Rhythm: Normal rate and regular rhythm.     Pulses: Normal pulses.     Heart sounds: Normal heart sounds. No murmur heard. Pulmonary:     Effort: Pulmonary effort is normal. No respiratory distress.     Breath sounds: Normal breath sounds. No wheezing.  Skin:    General: Skin is warm and dry.  Neurological:     General: No focal deficit present.     Mental Status: She is alert and oriented to person, place, and time.     Cranial Nerves: No cranial nerve deficit.     Motor: No weakness.  Psychiatric:        Mood and Affect: Mood normal.        Behavior: Behavior normal.        Thought Content: Thought content normal.        Judgment: Judgment normal.         Assessment And Plan:     1. Abnormal glucose Comments: She is no longer taking the Rybelsus due to side effects of "feeling funny".  Will check hemoglobin A1c - Hemoglobin A1c - BMP8+eGFR  2. Elevated cholesterol Comments: Diet controlled, encouraged to eat a low-fat diet and increase fiber intake - Lipid panel  3. Depression with anxiety Comments: Continue medications, doing  well all with current regimen  4. Tetanus, diphtheria, and acellular pertussis (Tdap) vaccination declined Comments: Discussed risk of not having tetanus with laceration.  5. COVID-19 vaccine series declined Declines covid 19 vaccine. Discussed risk of covid 37 and if she changes her mind about the vaccine to call the office. Education has been provided regarding the importance of this vaccine but patient still declined. Advised may receive this vaccine at local pharmacy or Health Dept.or vaccine clinic. Aware to provide a copy of the vaccination record if obtained from local pharmacy or Health Dept.  Encouraged to take multivitamin, vitamin d, vitamin c and zinc to increase immune system. Aware can call office if would like to have vaccine here at office. Verbalized acceptance and understanding.   Patient was given  opportunity to ask questions. Patient verbalized understanding of the plan and was able to repeat key elements of the plan. All questions were answered to their satisfaction.  Minette Brine, FNP    I, Minette Brine, FNP, have reviewed all documentation for this visit. The documentation on 02/28/22 for the exam, diagnosis, procedures, and orders are all accurate and complete.  IF YOU HAVE BEEN REFERRED TO A SPECIALIST, IT MAY TAKE 1-2 WEEKS TO SCHEDULE/PROCESS THE REFERRAL. IF YOU HAVE NOT HEARD FROM US/SPECIALIST IN TWO WEEKS, PLEASE GIVE [place] A CALL AT [phone].   THE PATIENT IS ENCOURAGED TO PRACTICE SOCIAL DISTANCING DUE TO THE COVID-19 PANDEMIC.

## 2022-03-01 LAB — LIPID PANEL
Chol/HDL Ratio: 4.4 ratio (ref 0.0–4.4)
Cholesterol, Total: 221 mg/dL — ABNORMAL HIGH (ref 100–199)
HDL: 50 mg/dL (ref >39)
LDL Chol Calc (NIH): 153 mg/dL — ABNORMAL HIGH (ref 0–99)
Triglycerides: 99 mg/dL (ref 0–149)
VLDL Cholesterol Cal: 18 mg/dL (ref 5–40)

## 2022-03-01 LAB — BMP8+EGFR
BUN/Creatinine Ratio: 11 (ref 9–23)
BUN: 8 mg/dL (ref 6–24)
CO2: 24 mmol/L (ref 20–29)
Calcium: 9.7 mg/dL (ref 8.7–10.2)
Chloride: 104 mmol/L (ref 96–106)
Creatinine, Ser: 0.73 mg/dL (ref 0.57–1.00)
Glucose: 89 mg/dL (ref 70–99)
Potassium: 4.1 mmol/L (ref 3.5–5.2)
Sodium: 142 mmol/L (ref 134–144)
eGFR: 101 mL/min/{1.73_m2} (ref >59)

## 2022-03-01 LAB — HEMOGLOBIN A1C
Est. average glucose Bld gHb Est-mCnc: 123 mg/dL
Hgb A1c MFr Bld: 5.9 % — ABNORMAL HIGH (ref 4.8–5.6)

## 2022-03-17 ENCOUNTER — Encounter: Payer: Self-pay | Admitting: Nurse Practitioner

## 2022-03-29 ENCOUNTER — Other Ambulatory Visit: Payer: Self-pay

## 2022-03-29 MED ORDER — RYBELSUS 14 MG PO TABS: 14.0000 mg | ORAL_TABLET | Freq: Every day | ORAL | 1 refills | Status: DC

## 2022-04-13 ENCOUNTER — Encounter: Payer: Self-pay | Admitting: Nurse Practitioner

## 2022-04-24 DIAGNOSIS — K5900 Constipation, unspecified: Secondary | ICD-10-CM | POA: Insufficient documentation

## 2022-04-24 DIAGNOSIS — K589 Irritable bowel syndrome without diarrhea: Secondary | ICD-10-CM | POA: Insufficient documentation

## 2022-04-24 DIAGNOSIS — R141 Gas pain: Secondary | ICD-10-CM | POA: Insufficient documentation

## 2022-04-24 DIAGNOSIS — K219 Gastro-esophageal reflux disease without esophagitis: Secondary | ICD-10-CM | POA: Insufficient documentation

## 2022-04-24 NOTE — Telephone Encounter (Signed)
Patient called to ask what alternatives to Rybelsus would be. This rx is not covered by her insurance.   Please advise, thank you!

## 2022-04-25 MED ORDER — METFORMIN HCL 500 MG PO TABS: 500.0000 mg | ORAL_TABLET | Freq: Two times a day (BID) | ORAL | 1 refills | Status: DC

## 2022-04-25 NOTE — Telephone Encounter (Signed)
The only option for prediabetes is metformin. If she agrees we can send in metformin 500 mg twice a day #60 tabs with 2 refills

## 2022-04-25 NOTE — Telephone Encounter (Signed)
Spoke with patient and she is agreeable to start Metformin. Directions reviewed with patient. Rx sent to pharmacy. Patient will call if she has any further questions or concerns.

## 2022-04-25 NOTE — Addendum Note (Signed)
Addended by: Pricilla Handler R on: 04/25/2022 11:01 AM   Modules accepted: Orders

## 2022-05-10 ENCOUNTER — Telehealth: Payer: Self-pay

## 2022-05-10 DIAGNOSIS — Z6830 Body mass index (BMI) 30.0-30.9, adult: Secondary | ICD-10-CM

## 2022-05-10 DIAGNOSIS — E6609 Other obesity due to excess calories: Secondary | ICD-10-CM

## 2022-05-10 NOTE — Telephone Encounter (Signed)
LVM for patient to return call. 

## 2022-05-10 NOTE — Telephone Encounter (Signed)
Spoke with patient and she states [employer] told her they do cover Rebylsus but she is not sure if they cover it for obesity or only diabetes. Advised patient to contact her insurance and ask for more specific information, also ask for alternatives for weight management. She will contact them for more information on what obesity treatment options they offer, she will be in touch with [place].

## 2022-05-10 NOTE — Telephone Encounter (Signed)
Patient called to report since starting Metformin she is having nausea, occasional diarrhea and frequent urination at night. Patient does not like the side effects.   Patient would like to go back on Rybelsus.   Please advise, thank you!

## 2022-05-10 NOTE — Telephone Encounter (Signed)
Her insurance will not cover Rybelsus because she is not a diabetic, we can send in metformin extended release and she can take with food to help with the side effects.

## 2022-05-17 NOTE — Telephone Encounter (Signed)
Patient called to report she talked to [facility] and they advised her they do cover Rybelsus, if she has tried/failed another medication first. She did not clarify with them f this med is covered for pre-dm or weight loss. She states they are to fax a PA form over for completion. Advised her I have not received form yet, would review and submit if necessary. Also advised would reach out to her with any further questions or concerns.

## 2022-05-22 NOTE — Telephone Encounter (Signed)
PA started via [facility]: [id number]  PA requested: Has the provider submitted medical records (for example, chart notes) confirming diagnosis of type 2 diabetes mellitus as evidenced by ONE of the following laboratory values: (1) A1C greater than or equal to 6.5%, (2) Fasting plasma glucose (FPG) greater than or equal to 126 mg/dL, (3) 2-hour plasma glucose (PG) greater than or equal to 200 mg/dL during OGTT (oral glucose tolerance test)? Failure to submit medical records may lead to an adverse determination.  A1C results have been submitted.

## 2022-05-23 NOTE — Telephone Encounter (Signed)
Per patient's insurance they have DENIED the PA for Rebylsus because patient does not have DM.   Patient has been advised. She will consider Metformin XR and will let us know.   Per Doreene Burke can place a referral to [facility]. Referral has been placed. Patient advised via [facility] message.

## 2022-05-23 NOTE — Addendum Note (Signed)
Addended by: Wadie Lessen on: 05/23/2022 12:09 PM   Modules accepted: Orders

## 2022-06-14 ENCOUNTER — Encounter: Payer: Commercial Managed Care - PPO | Admitting: Nurse Practitioner

## 2022-07-30 ENCOUNTER — Encounter: Payer: Self-pay | Admitting: Nurse Practitioner

## 2022-07-30 ENCOUNTER — Ambulatory Visit (INDEPENDENT_AMBULATORY_CARE_PROVIDER_SITE_OTHER): Payer: Commercial Managed Care - PPO | Admitting: Nurse Practitioner

## 2022-07-30 VITALS — BP 160/100 | HR 96 | Temp 98.9°F | Ht 65.0 in | Wt 208.0 lb

## 2022-07-30 DIAGNOSIS — Z28310 Unvaccinated for covid-19: Secondary | ICD-10-CM | POA: Diagnosis not present

## 2022-07-30 DIAGNOSIS — Z6834 Body mass index (BMI) 34.0-34.9, adult: Secondary | ICD-10-CM | POA: Insufficient documentation

## 2022-07-30 DIAGNOSIS — Z23 Encounter for immunization: Secondary | ICD-10-CM

## 2022-07-30 DIAGNOSIS — R7309 Other abnormal glucose: Secondary | ICD-10-CM | POA: Insufficient documentation

## 2022-07-30 DIAGNOSIS — E7800 Pure hypercholesterolemia, unspecified: Secondary | ICD-10-CM | POA: Insufficient documentation

## 2022-07-30 DIAGNOSIS — Z2821 Immunization not carried out because of patient refusal: Secondary | ICD-10-CM

## 2022-07-30 DIAGNOSIS — E221 Hyperprolactinemia: Secondary | ICD-10-CM | POA: Insufficient documentation

## 2022-07-30 MED ORDER — RYBELSUS 7 MG PO TABS: 1.0000 | ORAL_TABLET | Freq: Every day | ORAL | 1 refills | Status: DC

## 2022-07-30 NOTE — Progress Notes (Signed)
Hershal Coria Martin,acting as a Neurosurgeon for Arnette Felts, FNP.,have documented all relevant documentation on the behalf of Arnette Felts, FNP,as directed by  Arnette Felts, FNP while in the presence of Arnette Felts, FNP.    Subjective:     Patient ID: Shelby Case , female    DOB: [birth year] , 47 y.o.   MRN: [id number]   Chief Complaint  Patient presents with   Diabetes   Hyperlipidemia    HPI  Patient presents today for dm and chol check, patient states compliance with medications and has no other concerns today.  BP Readings from Last 3 Encounters: 07/30/22 : (!) 140/80 02/28/22 : 128/70 08/22/21 : 122/84  Wt Readings from Last 3 Encounters: 07/30/22 : 208 lb (94.3 kg) 02/28/22 : 203 lb (92.1 kg) 08/22/21 : 184 lb 3.2 oz (83.6 kg)        Past Medical History:  Diagnosis Date   Acute blood loss anemia 01/03/2013   Medical history non-contributory    Metrorrhagia 01/03/2013     Family History  Problem Relation Age of Onset   Irregular heart beat Father    Hypertension Brother      Current Outpatient Medications:    escitalopram (LEXAPRO) 10 MG tablet, TAKE 1 TABLET BY MOUTH EVERY DAY, Disp: 30 tablet, Rfl: 2   hydrOXYzine (ATARAX) 10 MG tablet, TAKE 1 TABLET BY MOUTH THREE TIMES A DAY AS NEEDED, Disp: 30 tablet, Rfl: 1   Semaglutide (RYBELSUS) 7 MG TABS, Take 1 tablet (7 mg total) by mouth daily., Disp: 90 tablet, Rfl: 1   Allergies  Allergen Reactions   Penicillins Other (See Comments)    Childhood reaction      Review of Systems  Constitutional: Negative.   Respiratory: Negative.    Cardiovascular: Negative.   Gastrointestinal: Negative.   Endocrine: Negative for polydipsia, polyphagia and polyuria.  Neurological: Negative.   Psychiatric/Behavioral: Negative.       Today's Vitals   07/30/22 1039  BP: (!) 140/80  Pulse: 96  Temp: 98.9 F (37.2 C)  TempSrc: Oral  Weight: 208 lb (94.3 kg)  Height: 5\' 5"  (1.651 m)  PainSc: 0-No pain   Body  mass index is 34.61 kg/m. Wt Readings from Last 3 Encounters:  07/30/22 208 lb (94.3 kg)  02/28/22 203 lb (92.1 kg)  08/22/21 184 lb 3.2 oz (83.6 kg)     The 10-year ASCVD risk score (Arnett DK, et al., 2019) is: 6.2%   Values used to calculate the score:     Age: 78 years     Sex: Female     Is Non-Hispanic African American: Yes     Diabetic: No     Tobacco smoker: Yes     Systolic Blood Pressure: 140 mmHg     Is BP treated: No     HDL Cholesterol: 50 mg/dL     Total Cholesterol: 221 mg/dL  Objective:  Physical Exam Vitals reviewed.  Constitutional:      General: She is not in acute distress.    Appearance: Normal appearance. She is obese.  Cardiovascular:     Rate and Rhythm: Normal rate and regular rhythm.     Pulses: Normal pulses.     Heart sounds: Normal heart sounds. No murmur heard. Pulmonary:     Effort: Pulmonary effort is normal. No respiratory distress.     Breath sounds: Normal breath sounds. No wheezing.  Skin:    General: Skin is warm and dry.  Neurological:  General: No focal deficit present.     Mental Status: She is alert and oriented to person, place, and time.     Cranial Nerves: No cranial nerve deficit.     Motor: No weakness.  Psychiatric:        Mood and Affect: Mood normal.        Behavior: Behavior normal.        Thought Content: Thought content normal.        Judgment: Judgment normal.         Assessment And Plan:     1. Abnormal glucose Comments: Patient is unable to tolerate metformin, she did well on Rybelsus in the past. Will provide sample of 3 mg and send Rx for 7mg . Since she has prediabetes and unable to tolerate metformin will see if her insurance will make an exception to cover Rybelsus. This will improve her quality of life.  - Hemoglobin A1c - Semaglutide (RYBELSUS) 7 MG TABS; Take 1 tablet (7 mg total) by mouth daily.  Dispense: 90 tablet; Refill: 1  2. Elevated cholesterol Comments: Cholesterol levels have been  elevated at last visit, encouraged to follow low fat diet. - Lipid panel  3. BMI 34.0-34.9,adult Comments: Unfortunately her weight has increased over the last year, since stopping taking the Rybelsus.  4. Need for Tdap vaccination - Tdap vaccine greater than or equal to 7yo IM  5. COVID-19 vaccine series declined Declines covid 19 vaccine. Discussed risk of covid 21 and if she changes her mind about the vaccine to call the office. Education has been provided regarding the importance of this vaccine but patient still declined. Advised may receive this vaccine at local pharmacy or Health Dept.or vaccine clinic. Aware to provide a copy of the vaccination record if obtained from local pharmacy or Health Dept.  Encouraged to take multivitamin, vitamin d, vitamin c and zinc to increase immune system. Aware can call office if would like to have vaccine here at office. Verbalized acceptance and understanding.   Return for physical 4 months .  Patient was given opportunity to ask questions. Patient verbalized understanding of the plan and was able to repeat key elements of the plan. All questions were answered to their satisfaction.  Arnette Felts, FNP   I, Arnette Felts, FNP, have reviewed all documentation for this visit. The documentation on 07/30/22 for the exam, diagnosis, procedures, and orders are all accurate and complete.   IF YOU HAVE BEEN REFERRED TO A SPECIALIST, IT MAY TAKE 1-2 WEEKS TO SCHEDULE/PROCESS THE REFERRAL. IF YOU HAVE NOT HEARD FROM US/SPECIALIST IN TWO WEEKS, PLEASE GIVE [place] A CALL AT [phone].   THE PATIENT IS ENCOURAGED TO PRACTICE SOCIAL DISTANCING DUE TO THE COVID-19 PANDEMIC.

## 2022-07-30 NOTE — Patient Instructions (Signed)

## 2022-07-31 LAB — LIPID PANEL
Chol/HDL Ratio: 5.1 ratio — ABNORMAL HIGH (ref 0.0–4.4)
Cholesterol, Total: 256 mg/dL — ABNORMAL HIGH (ref 100–199)
HDL: 50 mg/dL (ref >39)
LDL Chol Calc (NIH): 186 mg/dL — ABNORMAL HIGH (ref 0–99)
Triglycerides: 114 mg/dL (ref 0–149)
VLDL Cholesterol Cal: 20 mg/dL (ref 5–40)

## 2022-07-31 LAB — HEMOGLOBIN A1C
Est. average glucose Bld gHb Est-mCnc: 120 mg/dL
Hgb A1c MFr Bld: 5.8 % — ABNORMAL HIGH (ref 4.8–5.6)

## 2022-08-10 ENCOUNTER — Telehealth: Payer: Self-pay

## 2022-08-10 NOTE — Telephone Encounter (Signed)
PA for Rybelsus sent to plan.

## 2022-10-01 DIAGNOSIS — N898 Other specified noninflammatory disorders of vagina: Secondary | ICD-10-CM | POA: Diagnosis not present

## 2022-10-10 DIAGNOSIS — L700 Acne vulgaris: Secondary | ICD-10-CM | POA: Diagnosis not present

## 2022-10-10 DIAGNOSIS — L219 Seborrheic dermatitis, unspecified: Secondary | ICD-10-CM | POA: Diagnosis not present

## 2022-12-20 ENCOUNTER — Encounter: Payer: Commercial Managed Care - PPO | Admitting: Nurse Practitioner

## 2023-02-07 LAB — HM MAMMOGRAPHY

## 2023-02-12 ENCOUNTER — Encounter: Payer: Self-pay | Admitting: Obstetrics and Gynecology

## 2023-05-01 ENCOUNTER — Ambulatory Visit (INDEPENDENT_AMBULATORY_CARE_PROVIDER_SITE_OTHER): Payer: Commercial Managed Care - PPO | Admitting: Nurse Practitioner

## 2023-05-01 ENCOUNTER — Encounter: Payer: Self-pay | Admitting: Nurse Practitioner

## 2023-05-01 VITALS — BP 120/70 | HR 86 | Temp 97.7°F | Ht 65.0 in | Wt 189.2 lb

## 2023-05-01 DIAGNOSIS — R7309 Other abnormal glucose: Secondary | ICD-10-CM

## 2023-05-01 DIAGNOSIS — E7800 Pure hypercholesterolemia, unspecified: Secondary | ICD-10-CM

## 2023-05-01 DIAGNOSIS — E661 Drug-induced obesity: Secondary | ICD-10-CM

## 2023-05-01 DIAGNOSIS — Z0000 Encounter for general adult medical examination without abnormal findings: Secondary | ICD-10-CM | POA: Diagnosis not present

## 2023-05-01 DIAGNOSIS — K219 Gastro-esophageal reflux disease without esophagitis: Secondary | ICD-10-CM

## 2023-05-01 DIAGNOSIS — Z2821 Immunization not carried out because of patient refusal: Secondary | ICD-10-CM | POA: Insufficient documentation

## 2023-05-01 DIAGNOSIS — Z79899 Other long term (current) drug therapy: Secondary | ICD-10-CM

## 2023-05-01 DIAGNOSIS — F418 Other specified anxiety disorders: Secondary | ICD-10-CM

## 2023-05-01 DIAGNOSIS — Z6831 Body mass index (BMI) 31.0-31.9, adult: Secondary | ICD-10-CM

## 2023-05-01 DIAGNOSIS — E6609 Other obesity due to excess calories: Secondary | ICD-10-CM

## 2023-05-01 DIAGNOSIS — E221 Hyperprolactinemia: Secondary | ICD-10-CM

## 2023-05-01 DIAGNOSIS — E66811 Obesity, class 1: Secondary | ICD-10-CM

## 2023-05-01 MED ORDER — MUPIROCIN 2 % EX OINT: TOPICAL_OINTMENT | Freq: Two times a day (BID) | CUTANEOUS | 2 refills | Status: DC

## 2023-05-01 MED ORDER — HYDROXYZINE HCL 10 MG PO TABS: 10.0000 mg | ORAL_TABLET | Freq: Three times a day (TID) | ORAL | 1 refills | Status: AC | PRN

## 2023-05-01 MED ORDER — OMEPRAZOLE 20 MG PO CPDR: 20.0000 mg | DELAYED_RELEASE_CAPSULE | Freq: Every day | ORAL | 2 refills | Status: DC

## 2023-05-01 MED ORDER — MOMETASONE FUROATE 50 MCG/ACT NA SUSP: 2.0000 | Freq: Every day | NASAL | 2 refills | Status: DC

## 2023-05-01 NOTE — Patient Instructions (Signed)
Health Maintenance  Topic Date Due   COVID-19 Vaccine (1) 05/17/2023*   Flu Shot  06/17/2023*   Pneumococcal Vaccination (1 of 2 - PCV) 04/30/2024*   Mammogram  02/07/2024   Pap with HPV screening  03/16/2024   Colon Cancer Screening  08/23/2029   DTaP/Tdap/Td vaccine (3 - Td or Tdap) 07/29/2032   Hepatitis C Screening  Completed   HIV Screening  Completed   HPV Vaccine  Aged Out  *Topic was postponed. The date shown is not the original due date.

## 2023-05-01 NOTE — Assessment & Plan Note (Signed)
Will discontinue lexapro due to taking as needed and has been at least 5 months since last dose. Explained this medication is to be taken daily but at this time is not needed.

## 2023-05-01 NOTE — Progress Notes (Signed)
 Madelaine Bhat, CMA,acting as a Neurosurgeon for Arnette Felts, FNP.,have documented all relevant documentation on the behalf of Arnette Felts, FNP,as directed by  Arnette Felts, FNP while in the presence of Arnette Felts, FNP.  Subjective:    Patient ID: Shelby Case , female    DOB: [birth year] , 48 y.o.   MRN: [id number]  Chief Complaint  Patient presents with   Annual Exam    HPI No complaints at this time.  Patient states that she has recently started exercising weekly doing pilates.  She follows a low sugar diet and has increased her water intake.  States that she has no issues with memory and concentration, no recent issues with anxiety.depression.  Performs monthly breast exams, has been for her annual mamogram, and is scheduled for a pap smear tomorrow with Dr. Cherly Hensen.  Works in Therapist, sports.   Patient presents today for HM, Patient reports compliance with medication. Patient reports she needs a refill on her antibiotic ointment that she used for her  boils.   She has been taking famotidine daily to help with gastric reflux. If she does not take anything she feels like she will have vomiting. She will just feel bad throughout the day. She has just started a probiotic.   She was diagnosed with compressed pituatary. She was seen by Dr. Talmage Nap and had an MRI of brain.      Past Medical History:  Diagnosis Date   Acute blood loss anemia 01/03/2013   Medical history non-contributory    Metrorrhagia 01/03/2013     Family History  Problem Relation Age of Onset   Irregular heart beat Father    Hypertension Brother      Current Outpatient Medications:    mometasone (NASONEX) 50 MCG/ACT nasal spray, Place 2 sprays into the nose daily., Disp: 1 each, Rfl: 2   omeprazole (PRILOSEC) 20 MG capsule, Take 1 capsule (20 mg total) by mouth daily., Disp: 30 capsule, Rfl: 2   hydrOXYzine (ATARAX) 10 MG tablet, Take 1 tablet (10 mg total) by mouth 3 (three) times daily as needed., Disp:  30 tablet, Rfl: 1   mupirocin ointment (BACTROBAN) 2 %, Apply topically 2 (two) times daily., Disp: 22 g, Rfl: 2   Allergies  Allergen Reactions   Penicillins Other (See Comments)    Childhood reaction       The patient states she uses none for birth control. No LMP recorded (lmp unknown). (Menstrual status: Irregular Periods).   Negative for Dysmenorrhea and Negative for Menorrhagia. Negative for: breast discharge, breast lump(s), breast pain and breast self exam. Associated symptoms include abnormal vaginal bleeding. Pertinent negatives include abnormal bleeding (hematology), anxiety, decreased libido, depression, difficulty falling sleep, dyspareunia, history of infertility, nocturia, sexual dysfunction, sleep disturbances, urinary incontinence, urinary urgency, vaginal discharge and vaginal itching.  The patient's tobacco use is:  Social History   Tobacco Use  Smoking Status Light Smoker   Current packs/day: 0.00   Types: Cigarettes   Last attempt to quit: 12/27/2012   Years since quitting: 10.3  Smokeless Tobacco Never  Tobacco Comments   occassionally will smoke Black and Mild - not as much as before; 05/01/2023 - 1-2 cigars per day.   She has been exposed to passive smoke. The patient's alcohol use is:  Social History   Substance and Sexual Activity  Alcohol Use No  Additional information: Last pap 03/16/2021, next one scheduled for 03/16/2024.    Review of Systems  Constitutional: Negative.   HENT: Negative.  Eyes: Negative.   Respiratory: Negative.    Cardiovascular: Negative.   Gastrointestinal: Negative.   Endocrine: Negative.   Genitourinary: Negative.   Musculoskeletal: Negative.   Skin: Negative.   Allergic/Immunologic: Negative.   Neurological: Negative.   Hematological: Negative.   Psychiatric/Behavioral: Negative.       Today's Vitals   05/01/23 1454  BP: 120/70  Pulse: 86  Temp: 97.7 F (36.5 C)  TempSrc: Oral  Weight: 189 lb 3.2 oz (85.8 kg)   Height: 5\' 5"  (1.651 m)  PainSc: 0-No pain   Body mass index is 31.48 kg/m.  Wt Readings from Last 3 Encounters:  05/01/23 189 lb 3.2 oz (85.8 kg)  07/30/22 208 lb (94.3 kg)  02/28/22 203 lb (92.1 kg)      05/01/2023    2:53 PM 07/30/2022   11:02 AM 07/30/2022   10:35 AM 02/28/2022   11:47 AM 08/16/2021    9:24 AM  Depression screen PHQ 2/9  Decreased Interest 0 0 0 0 3  Down, Depressed, Hopeless 0 0 0 0 3  PHQ - 2 Score 0 0 0 0 6  Altered sleeping 0 1  1 3   Tired, decreased energy 0 0  0 0  Change in appetite 0 0  0 3  Feeling bad or failure about yourself  0 0  0 2  Trouble concentrating 0 0  0 3  Moving slowly or fidgety/restless 0 0  0 0  Suicidal thoughts 0 0  0 0  PHQ-9 Score 0 1  1 17   Difficult doing work/chores Not difficult at all Not difficult at all   Extremely dIfficult      05/01/2023    2:54 PM 08/16/2021    9:46 AM  GAD 7 : Generalized Anxiety Score  Nervous, Anxious, on Edge 0 3  Control/stop worrying 0 3  Worry too much - different things 0 3  Trouble relaxing 0 3  Restless 0 1  Easily annoyed or irritable 0 2  Afraid - awful might happen 0 0  Total GAD 7 Score 0 15  Anxiety Difficulty Not difficult at all Somewhat difficult   Objective:  Physical Exam Vitals reviewed.  Constitutional:      General: She is not in acute distress.    Appearance: Normal appearance. She is well-developed. She is obese.  HENT:     Head: Normocephalic and atraumatic.     Right Ear: Hearing, tympanic membrane, ear canal and external ear normal.     Left Ear: Hearing, tympanic membrane, ear canal and external ear normal.     Nose:     Right Turbinates: Swollen.     Left Turbinates: Swollen.     Mouth/Throat:     Mouth: Mucous membranes are moist.  Eyes:     General: Lids are normal.     Conjunctiva/sclera: Conjunctivae normal.     Pupils: Pupils are equal, round, and reactive to light.     Funduscopic exam:    Right eye: No papilledema.        Left eye: No  papilledema.  Neck:     Thyroid: No thyroid mass.     Vascular: No carotid bruit.  Cardiovascular:     Rate and Rhythm: Normal rate and regular rhythm.     Pulses: Normal pulses.     Heart sounds: Normal heart sounds. No murmur heard. Pulmonary:     Effort: Pulmonary effort is normal. No respiratory distress.     Breath sounds: Normal breath  sounds. No wheezing.  Abdominal:     General: Abdomen is flat. Bowel sounds are normal.     Palpations: Abdomen is soft.  Musculoskeletal:        General: No swelling. Normal range of motion.     Cervical back: Full passive range of motion without pain, normal range of motion and neck supple.     Right lower leg: No edema.     Left lower leg: No edema.  Skin:    General: Skin is warm and dry.     Capillary Refill: Capillary refill takes less than 2 seconds.  Neurological:     General: No focal deficit present.     Mental Status: She is alert and oriented to person, place, and time.     Cranial Nerves: No cranial nerve deficit.     Sensory: No sensory deficit.  Psychiatric:        Mood and Affect: Mood normal.        Behavior: Behavior normal.        Thought Content: Thought content normal.        Judgment: Judgment normal.         Assessment And Plan:     Encounter for annual health examination Assessment & Plan: Behavior modifications discussed and diet history reviewed.   Pt will continue to exercise regularly and modify diet with low GI, plant based foods and decrease intake of processed foods.  Recommend intake of daily multivitamin, Vitamin D, and calcium.  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 Advised to aim for at least 150 minutes of  exercise per week.    COVID-19 vaccination declined Assessment & Plan: Declines covid 19 vaccine. Discussed risk of covid 43 and if she changes her mind about the vaccine to call the office. Education has been provided regarding the importance of this vaccine but patient still declined. Advised may receive this vaccine at local pharmacy or Health Dept.or vaccine clinic. Aware to provide a copy of the vaccination record if obtained from local pharmacy or Health Dept.  Encouraged to take multivitamin, vitamin d, vitamin c and zinc to increase immune system. Aware can call office if would like to have vaccine here at office. Verbalized acceptance and understanding.    Influenza vaccination declined Assessment & Plan: Patient declined influenza vaccination at this time. Patient is aware that influenza vaccine prevents illness in 70% of healthy people, and reduces hospitalizations to 30-70% in elderly. This vaccine is recommended annually. Education has been provided regarding the importance of this vaccine but patient still declined. Advised may receive this vaccine at local pharmacy or Health Dept.or vaccine clinic. Aware to provide a copy of the vaccination record if obtained from local pharmacy or Health Dept.  Pt is willing to accept risk associated with refusing vaccination.    Abnormal glucose Assessment & Plan: HgbA1c is stable.   Orders: -     Hemoglobin A1c  Elevated cholesterol Assessment & Plan: Cholesterol levels are stable. Continue focusing on low fat diet.  Orders: -     CMP14+EGFR -     Lipid panel  Depression with anxiety Assessment & Plan: Will discontinue lexapro due to taking as needed and has been at least  5 months since last dose. Explained this medication is to be taken daily but at this time is not needed.   Orders: -     hydrOXYzine HCl; Take 1 tablet (10 mg total) by mouth 3 (three) times daily as needed.  Dispense: 30 tablet; Refill: 1  Gastroesophageal  reflux disease without esophagitis Assessment & Plan: Continue low fat diet and stable. Continue omeprazole  Orders: -     Omeprazole; Take 1 capsule (20 mg total) by mouth daily.  Dispense: 30 capsule; Refill: 2  Hyperprolactinemia (HCC) Assessment & Plan: She was recently diagnosed with compressed pituatary   Other long term (current) drug therapy -     CBC with Differential/Platelet  Other orders -     Mupirocin; Apply topically 2 (two) times daily.  Dispense: 22 g; Refill: 2 -     Mometasone Furoate; Place 2 sprays into the nose daily.  Dispense: 1 each; Refill: 2   Return for 1 year physical, 6 month chol check. Patient was given opportunity to ask questions. Patient verbalized understanding of the plan and was able to repeat key elements of the plan. All questions were answered to their satisfaction.   Arnette Felts, FNP  I, Arnette Felts, FNP, have reviewed all documentation for this visit. The documentation on 05/01/23 for the exam, diagnosis, procedures, and orders are all accurate and complete.

## 2023-05-01 NOTE — Assessment & Plan Note (Signed)
She was recently diagnosed with compressed pituatary

## 2023-05-02 ENCOUNTER — Encounter: Payer: Self-pay | Admitting: Nurse Practitioner

## 2023-05-02 LAB — CBC WITH DIFFERENTIAL/PLATELET
Basophils Absolute: 0.1 10*3/uL (ref 0.0–0.2)
Basos: 1 %
EOS (ABSOLUTE): 0.2 10*3/uL (ref 0.0–0.4)
Eos: 3 %
Hematocrit: 40.2 % (ref 34.0–46.6)
Hemoglobin: 13.7 g/dL (ref 11.1–15.9)
Immature Grans (Abs): 0 10*3/uL (ref 0.0–0.1)
Immature Granulocytes: 0 %
Lymphocytes Absolute: 2.6 10*3/uL (ref 0.7–3.1)
Lymphs: 41 %
MCH: 33.7 pg — ABNORMAL HIGH (ref 26.6–33.0)
MCHC: 34.1 g/dL (ref 31.5–35.7)
MCV: 99 fL — ABNORMAL HIGH (ref 79–97)
Monocytes Absolute: 0.4 10*3/uL (ref 0.1–0.9)
Monocytes: 7 %
Neutrophils Absolute: 3.1 10*3/uL (ref 1.4–7.0)
Neutrophils: 48 %
Platelets: 297 10*3/uL (ref 150–450)
RBC: 4.06 x10E6/uL (ref 3.77–5.28)
RDW: 12.9 % (ref 11.7–15.4)
WBC: 6.4 10*3/uL (ref 3.4–10.8)

## 2023-05-02 LAB — CMP14+EGFR
ALT: 8 [IU]/L (ref 0–32)
AST: 12 [IU]/L (ref 0–40)
Albumin: 4.4 g/dL (ref 3.9–4.9)
Alkaline Phosphatase: 59 [IU]/L (ref 44–121)
BUN/Creatinine Ratio: 13 (ref 9–23)
BUN: 10 mg/dL (ref 6–24)
Bilirubin Total: 0.3 mg/dL (ref 0.0–1.2)
CO2: 27 mmol/L (ref 20–29)
Calcium: 9.1 mg/dL (ref 8.7–10.2)
Chloride: 104 mmol/L (ref 96–106)
Creatinine, Ser: 0.75 mg/dL (ref 0.57–1.00)
Globulin, Total: 2.1 g/dL (ref 1.5–4.5)
Glucose: 78 mg/dL (ref 70–99)
Potassium: 3.9 mmol/L (ref 3.5–5.2)
Sodium: 141 mmol/L (ref 134–144)
Total Protein: 6.5 g/dL (ref 6.0–8.5)
eGFR: 98 mL/min/{1.73_m2} (ref >59)

## 2023-05-02 LAB — HEMOGLOBIN A1C
Est. average glucose Bld gHb Est-mCnc: 108 mg/dL
Hgb A1c MFr Bld: 5.4 % (ref 4.8–5.6)

## 2023-05-02 LAB — LIPID PANEL
Chol/HDL Ratio: 5.9 {ratio} — ABNORMAL HIGH (ref 0.0–4.4)
Cholesterol, Total: 225 mg/dL — ABNORMAL HIGH (ref 100–199)
HDL: 38 mg/dL — ABNORMAL LOW (ref >39)
LDL Chol Calc (NIH): 168 mg/dL — ABNORMAL HIGH (ref 0–99)
Triglycerides: 107 mg/dL (ref 0–149)
VLDL Cholesterol Cal: 19 mg/dL (ref 5–40)

## 2023-05-14 NOTE — Assessment & Plan Note (Signed)

## 2023-05-14 NOTE — Assessment & Plan Note (Signed)

## 2023-05-14 NOTE — Assessment & Plan Note (Signed)
 She is encouraged to strive for BMI less than 30 to decrease cardiac risk. Advised to aim for at least 150 minutes of exercise per week.

## 2023-05-14 NOTE — Assessment & Plan Note (Signed)
 Cholesterol levels are stable.  Continue focusing on low-fat diet.

## 2023-05-14 NOTE — Assessment & Plan Note (Signed)
 HgbA1c is stable.

## 2023-05-14 NOTE — Assessment & Plan Note (Addendum)
 Continue low fat diet and stable. Continue omeprazole

## 2023-05-14 NOTE — Assessment & Plan Note (Signed)

## 2023-05-17 NOTE — Code Documentation (Signed)
 That was coming from the other side of the notation, not sure why. It has been updated

## 2023-10-18 ENCOUNTER — Other Ambulatory Visit: Payer: Self-pay

## 2023-10-29 NOTE — Progress Notes (Deleted)
 LILLETTE Kristeen JINNY Gladis, CMA,acting as a Neurosurgeon for [name], FNP.,have documented all relevant documentation on the behalf of [name], FNP,as directed by  [name], FNP while in the presence of [name], FNP.  Subjective:  Patient ID: Shelby Case , female    DOB: [birth year] , 48 y.o.   MRN: [id number]  No chief complaint on file.   HPI  HPI   Past Medical History:  Diagnosis Date   Acute blood loss anemia 01/03/2013   Medical history non-contributory    Metrorrhagia 01/03/2013     Family History  Problem Relation Age of Onset   Irregular heart beat Father    Hypertension Brother      Current Outpatient Medications:    hydrOXYzine  (ATARAX ) 10 MG tablet, Take 1 tablet (10 mg total) by mouth 3 (three) times daily as needed., Disp: 30 tablet, Rfl: 1   mometasone  (NASONEX ) 50 MCG/ACT nasal spray, Place 2 sprays into the nose daily., Disp: 1 each, Rfl: 2   mupirocin  ointment (BACTROBAN ) 2 %, Apply topically 2 (two) times daily., Disp: 22 g, Rfl: 2   omeprazole  (PRILOSEC) 20 MG capsule, Take 1 capsule (20 mg total) by mouth daily., Disp: 30 capsule, Rfl: 2   Allergies  Allergen Reactions   Penicillins Other (See Comments)    Childhood reaction      Review of Systems   There were no vitals filed for this visit. There is no height or weight on file to calculate BMI.  Wt Readings from Last 3 Encounters:  05/01/23 189 lb 3.2 oz (85.8 kg)  07/30/22 208 lb (94.3 kg)  02/28/22 203 lb (92.1 kg)    The 10-year ASCVD risk score (Arnett DK, et al., 2019) is: 5.5%   Values used to calculate the score:     Age: 19 years     Clincally relevant sex: Female     Is Non-Hispanic African American: Yes     Diabetic: No     Tobacco smoker: Yes     Systolic Blood Pressure: 120 mmHg     Is BP treated: No     HDL Cholesterol: 38 mg/dL     Total Cholesterol: 225 mg/dL  Objective:  Physical Exam      Assessment And Plan:  Elevated cholesterol  Abnormal  glucose    No follow-ups on file.  Patient was given opportunity to ask questions. Patient verbalized understanding of the plan and was able to repeat key elements of the plan. All questions were answered to their satisfaction.    LILLETTE [name], FNP, have reviewed all documentation for this visit. The documentation on 10/29/23 for the exam, diagnosis, procedures, and orders are all accurate and complete.   IF YOU HAVE BEEN REFERRED TO A SPECIALIST, IT MAY TAKE 1-2 WEEKS TO SCHEDULE/PROCESS THE REFERRAL. IF YOU HAVE NOT HEARD FROM US /SPECIALIST IN TWO WEEKS, PLEASE GIVE US  A CALL AT [phone].

## 2023-10-30 ENCOUNTER — Ambulatory Visit: Payer: Commercial Managed Care - PPO | Admitting: Nurse Practitioner

## 2023-10-30 DIAGNOSIS — R7309 Other abnormal glucose: Secondary | ICD-10-CM

## 2023-10-30 DIAGNOSIS — E7800 Pure hypercholesterolemia, unspecified: Secondary | ICD-10-CM

## 2023-11-20 NOTE — Progress Notes (Signed)
 LILLETTE Kristeen JINNY Gladis, CMA,acting as a Neurosurgeon for Shelby Ada, FNP.,have documented all relevant documentation on the behalf of Shelby Ada, FNP,as directed by  Shelby Ada, FNP while in the presence of Shelby Ada, FNP.  Subjective:  Patient ID: Shelby Case , female    DOB: [birth year] , 48 y.o.   MRN: [id number]  Chief Complaint  Patient presents with   Hyperlipidemia    Patient presents today for a chol and pre dm follow up, Patient reports compliance with medication. Patient denies any chest pain, SOB, or headaches. Patient has no concerns today.       HPI  Discussed the use of AI scribe software for clinical note transcription with the patient, who gave verbal consent to proceed.  History of Present Illness Shelby Case is a 48 year old female who presents for a cholesterol follow-up.  She is not currently taking any medications for her cholesterol. Recent lab results show an HDL of 38, LDL of 168, and triglycerides of 107. No diet high in fried or fatty foods. No known family history of cholesterol issues.  Her A1c has improved to 5.4 from a previous high of 5.9. Blood pressure is 140/70. She acknowledges not exercising regularly, although she was previously more active with walking and weight exercises. Personal circumstances have hindered her ability to maintain this routine.  She is experiencing significant stress due to her housemate Eric's recent cancer diagnosis and treatment, impacting her focus on personal health. She manages household responsibilities and financial burdens largely on her own, as her husband is not currently working and has only recently started receiving disability benefits.  She has recently started taking a probiotic, as recommended by her daughter, to address digestive issues where she feels like her food 'just sits there'.  She feels overwhelmed and lacks time for herself, being the primary caregiver for her husband, who has undergone  chemotherapy and radiation. She notes a lack of support from her husband's family, leaving her to handle most responsibilities alone.   Past Medical History:  Diagnosis Date   Acute blood loss anemia 01/03/2013   Medical history non-contributory    Metrorrhagia 01/03/2013     Family History  Problem Relation Age of Onset   Irregular heart beat Father    Hypertension Brother      Current Outpatient Medications:    hydrOXYzine  (ATARAX ) 10 MG tablet, Take 1 tablet (10 mg total) by mouth 3 (three) times daily as needed., Disp: 30 tablet, Rfl: 1   mometasone  (NASONEX ) 50 MCG/ACT nasal spray, Place 2 sprays into the nose daily., Disp: 1 each, Rfl: 2   mupirocin  ointment (BACTROBAN ) 2 %, Apply topically 2 (two) times daily., Disp: 22 g, Rfl: 2   omeprazole  (PRILOSEC) 20 MG capsule, Take 1 capsule (20 mg total) by mouth daily., Disp: 30 capsule, Rfl: 2   tirzepatide  (ZEPBOUND ) 2.5 MG/0.5ML Pen, Inject 2.5 mg into the skin once a week., Disp: 2 mL, Rfl: 0   Allergies  Allergen Reactions   Penicillins Other (See Comments)    Childhood reaction      Review of Systems  Constitutional: Negative.   Respiratory: Negative.    Cardiovascular: Negative.   Gastrointestinal: Negative.   Endocrine: Negative for polydipsia, polyphagia and polyuria.  Neurological: Negative.   Psychiatric/Behavioral: Negative.       Today's Vitals   11/21/23 1159  BP: (!) 140/70  Pulse: 94  Temp: 98.1 F (36.7 C)  TempSrc: Oral  Weight: 214 lb 3.2 oz (  97.2 kg)  Height: 5' 5 (1.651 m)  PainSc: 0-No pain   Body mass index is 35.64 kg/m.  Wt Readings from Last 3 Encounters:  11/21/23 214 lb 3.2 oz (97.2 kg)  05/01/23 189 lb 3.2 oz (85.8 kg)  07/30/22 208 lb (94.3 kg)      Objective:  Physical Exam Vitals and nursing note reviewed.  Constitutional:      General: She is not in acute distress.    Appearance: Normal appearance. She is obese.  Cardiovascular:     Rate and Rhythm: Normal rate and  regular rhythm.     Pulses: Normal pulses.     Heart sounds: Normal heart sounds. No murmur heard. Pulmonary:     Effort: Pulmonary effort is normal. No respiratory distress.     Breath sounds: Normal breath sounds. No wheezing.  Skin:    General: Skin is warm and dry.  Neurological:     General: No focal deficit present.     Mental Status: She is alert and oriented to person, place, and time.     Cranial Nerves: No cranial nerve deficit.     Motor: No weakness.  Psychiatric:        Mood and Affect: Mood normal.        Behavior: Behavior normal.        Thought Content: Thought content normal.        Judgment: Judgment normal.        11/21/2023   12:00 PM 05/01/2023    2:53 PM 07/30/2022   11:02 AM 07/30/2022   10:35 AM 02/28/2022   11:47 AM  Depression screen PHQ 2/9  Decreased Interest 0 0 0 0 0  Down, Depressed, Hopeless 0 0 0 0 0  PHQ - 2 Score 0 0 0 0 0  Altered sleeping 0 0 1  1  Tired, decreased energy 0 0 0  0  Change in appetite 0 0 0  0  Feeling bad or failure about yourself  0 0 0  0  Trouble concentrating 0 0 0  0  Moving slowly or fidgety/restless 0 0 0  0  Suicidal thoughts 0 0 0  0  PHQ-9 Score 0 0 1  1  Difficult doing work/chores Not difficult at all Not difficult at all Not difficult at all        11/21/2023   12:00 PM 05/01/2023    2:54 PM 08/16/2021    9:46 AM  GAD 7 : Generalized Anxiety Score  Nervous, Anxious, on Edge 0 0 3  Control/stop worrying 0 0 3  Worry too much - different things 0 0 3  Trouble relaxing 0 0 3  Restless 0 0 1  Easily annoyed or irritable 0 0 2  Afraid - awful might happen 0 0 0  Total GAD 7 Score 0 0 15  Anxiety Difficulty Not difficult at all Not difficult at all Somewhat difficult      Assessment And Plan:  Mixed hyperlipidemia Assessment & Plan: Discussed increased cardiac risk, especially post-50. - Order lipoprotein(a) test to assess for genetic predisposition. - Consider referral to cardiologist if genetic  predisposition is confirmed. - Discuss potential initiation of low-dose medication if genetic predisposition is confirmed.  Orders: -     Lipid panel -     Lipoprotein A (LPA)  Abnormal glucose Assessment & Plan: A1c stable at 5.4%, previously 5.9%. congratulated on improving this number - Recheck A1c to ensure stability.  Orders: -  Hemoglobin A1c  Influenza vaccination declined  COVID-19 vaccination declined  Herpes zoster vaccination declined  Class 2 obesity due to excess calories with body mass index (BMI) of 35.0 to 35.9 in adult, unspecified whether serious comorbidity present Assessment & Plan: Discussed weight management and medication options, emphasizing lifestyle changes. Reviewed insurance coverage for medications. - Update insurance information for medication coverage. - Consider prescribing Zepbound  for weight loss if covered by insurance. - Schedule follow-up in two months for weight check.  Orders: -     Zepbound ; Inject 2.5 mg into the skin once a week.  Dispense: 2 mL; Refill: 0  Encounter for screening -     Hepatitis B surface antibody,qualitative  Class 2 severe obesity due to excess calories with serious comorbidity and body mass index (BMI) of 35.0 to 35.9 in adult [facility]) Assessment & Plan: Discussed weight management and medication options, emphasizing lifestyle changes. Reviewed insurance coverage for medications. - Update insurance information for medication coverage. - Consider prescribing Zepbound  for weight loss if covered by insurance. - Schedule follow-up in two months for weight check.   Caregiver stress Assessment & Plan: Significant stress from caregiving and financial strain. Discussed self-care and counseling benefits. - Designer, industrial/product for counseling services. - Emphasize importance of self-care and taking time for herself.     Return for keep same next.  Patient was given opportunity to  ask questions. Patient verbalized understanding of the plan and was able to repeat key elements of the plan. All questions were answered to their satisfaction.    LILLETTE Shelby Ada, FNP, have reviewed all documentation for this visit. The documentation on 11/21/23 for the exam, diagnosis, procedures, and orders are all accurate and complete.   IF YOU HAVE BEEN REFERRED TO A SPECIALIST, IT MAY TAKE 1-2 WEEKS TO SCHEDULE/PROCESS THE REFERRAL. IF YOU HAVE NOT HEARD FROM US /SPECIALIST IN TWO WEEKS, PLEASE GIVE US  A CALL AT [phone].

## 2023-11-21 ENCOUNTER — Other Ambulatory Visit: Payer: Self-pay | Admitting: Nurse Practitioner

## 2023-11-21 ENCOUNTER — Ambulatory Visit: Admitting: Nurse Practitioner

## 2023-11-21 ENCOUNTER — Encounter: Payer: Self-pay | Admitting: Nurse Practitioner

## 2023-11-21 VITALS — BP 140/70 | HR 94 | Temp 98.1°F | Ht 65.0 in | Wt 214.2 lb

## 2023-11-21 DIAGNOSIS — E6609 Other obesity due to excess calories: Secondary | ICD-10-CM

## 2023-11-21 DIAGNOSIS — E7800 Pure hypercholesterolemia, unspecified: Secondary | ICD-10-CM

## 2023-11-21 DIAGNOSIS — E66812 Obesity, class 2: Secondary | ICD-10-CM | POA: Diagnosis not present

## 2023-11-21 DIAGNOSIS — Z2821 Immunization not carried out because of patient refusal: Secondary | ICD-10-CM

## 2023-11-21 DIAGNOSIS — E782 Mixed hyperlipidemia: Secondary | ICD-10-CM | POA: Diagnosis not present

## 2023-11-21 DIAGNOSIS — Z139 Encounter for screening, unspecified: Secondary | ICD-10-CM

## 2023-11-21 DIAGNOSIS — R7309 Other abnormal glucose: Secondary | ICD-10-CM | POA: Diagnosis not present

## 2023-11-21 DIAGNOSIS — Z6835 Body mass index (BMI) 35.0-35.9, adult: Secondary | ICD-10-CM

## 2023-11-21 DIAGNOSIS — Z636 Dependent relative needing care at home: Secondary | ICD-10-CM

## 2023-11-21 IMAGING — MR MR HEAD WO/W CM
21 series · 48 of 48 positions shown · IV contrast (MULTIHANCE)
Comparison: None.

CLINICAL DATA: Hyperprolactinemia.

EXAM:
MRI HEAD WITHOUT AND WITH CONTRAST
TECHNIQUE: Multiplanar, multiecho pulse sequences of the brain and surrounding
structures were obtained without and with intravenous contrast.
CONTRAST:  9mL MULTIHANCE GADOBENATE DIMEGLUMINE 529 MG/ML IV SOLN

[Series 5: T1 · sagittal · 4.0mm · 0.75mm/px · 1 of 30 slices shown (1 of 5)]
[im 1/30]
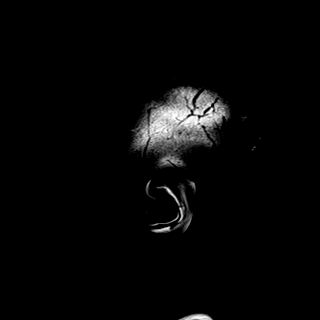

[Series 6: DWI · axial · 3.0mm · 0.94mm/px · z∈[-130,+15]mm · 9 of 168 slices shown]
[im 1/168]
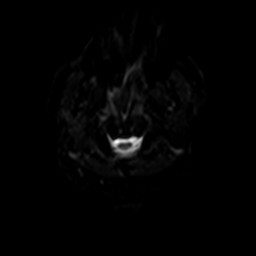
[im 21/168]
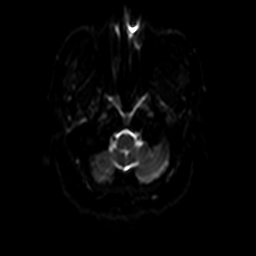
[im 42/168]
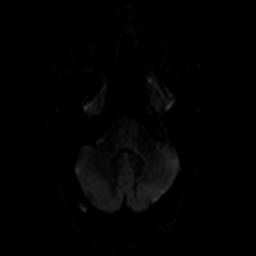
[im 63/168]
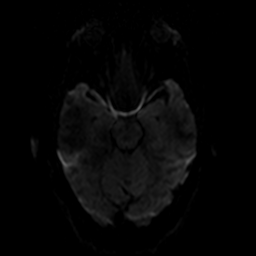
[im 84/168]
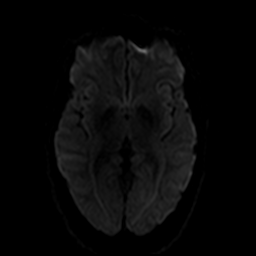
[im 105/168]
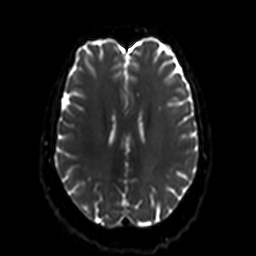
[im 126/168]
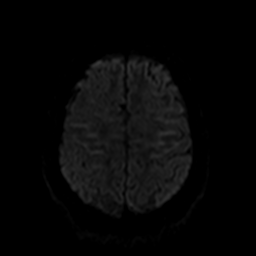
[im 147/168]
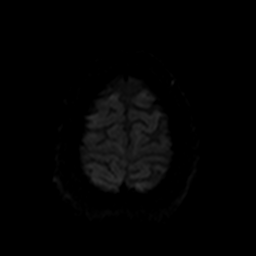
[im 168/168]
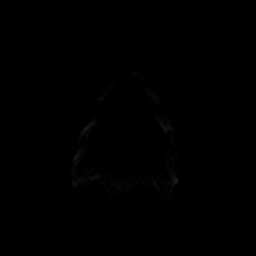

[Series 7: ax dwi_tracew · axial · 3.0mm · 0.94mm/px · z∈[-130,+15]mm · 4 of 84 slices shown]
[im 1/84]
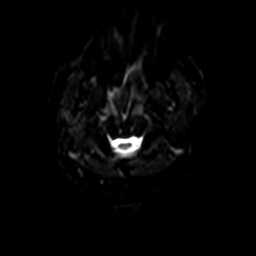
[im 28/84]
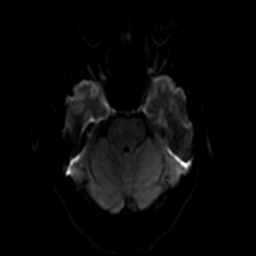
[im 56/84]
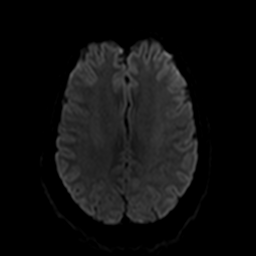
[im 84/84]
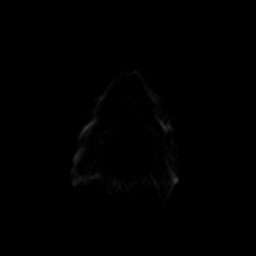

[Series 8: ax dwi_adc · axial · 3.0mm · 0.94mm/px · 1 of 42 slices shown]
[im 1/42]
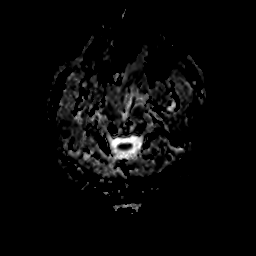

[Series 9: T2 · axial · 4.0mm · 0.36mm/px · 1 of 30 slices shown (1 of 2)]
[im 1/30]
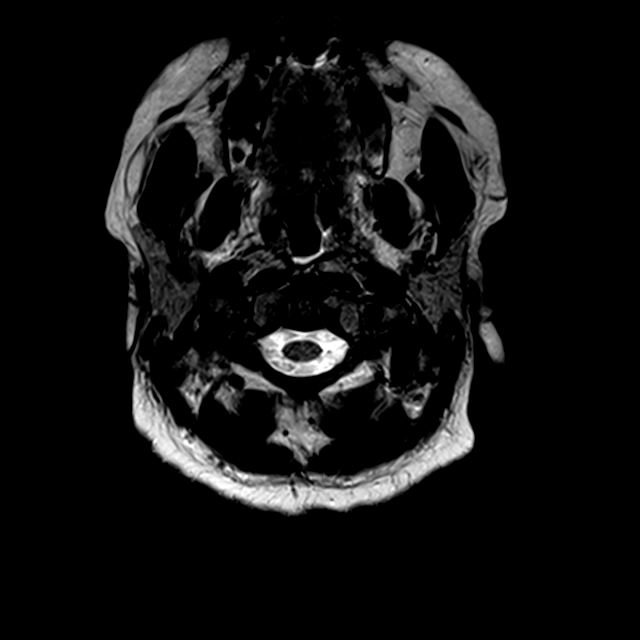

[Series 10: swi_images · axial · 1.5mm · 0.90mm/px · z∈[-127,+12]mm · 6 of 96 slices shown]
[im 1/96]
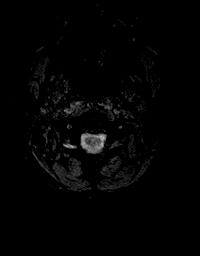
[im 20/96]
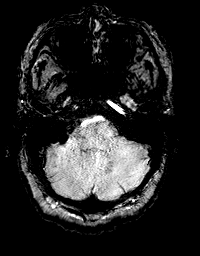
[im 39/96]
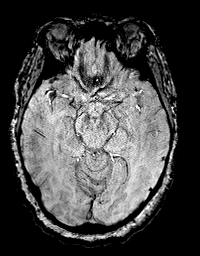
[im 58/96]
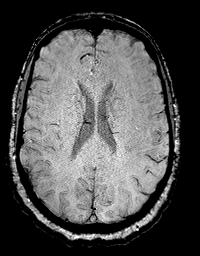
[im 77/96]
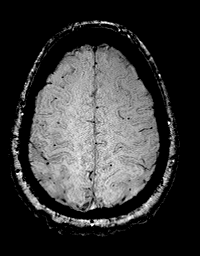
[im 96/96]
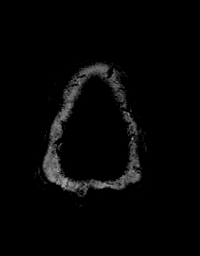

[Series 11: mip_images(sw) · axial · 12.0mm · 0.90mm/px · z∈[-122,+7]mm · 5 of 89 slices shown]
[im 1/89]
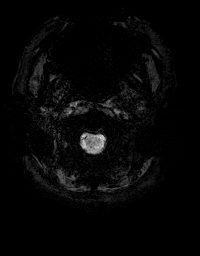
[im 23/89]
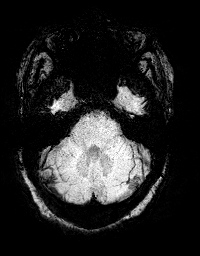
[im 45/89]
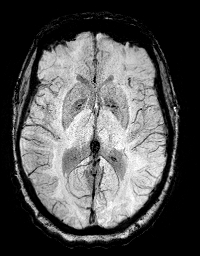
[im 67/89]
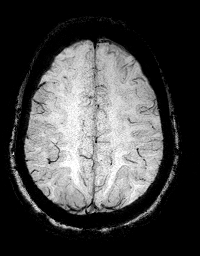
[im 89/89]
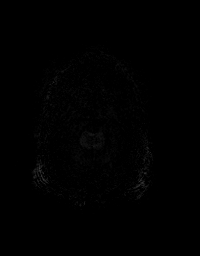

[Series 12: T1 · sagittal · 3.0mm · 0.42mm/px · 1 of 13 slices shown (2 of 5)]
[im 1/13]
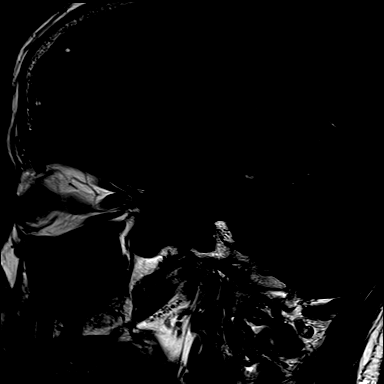

[Series 13: FLAIR · axial · 3.0mm · 0.72mm/px · 1 of 24 slices shown]
[im 1/24]
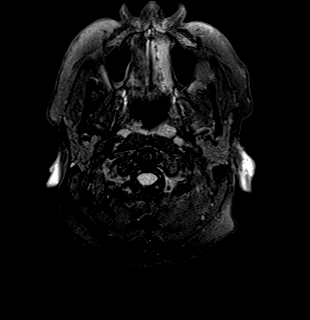

[Series 14: T2 · coronal · 3.0mm · 0.42mm/px · 1 of 10 slices shown (2 of 2)]
[im 1/10]
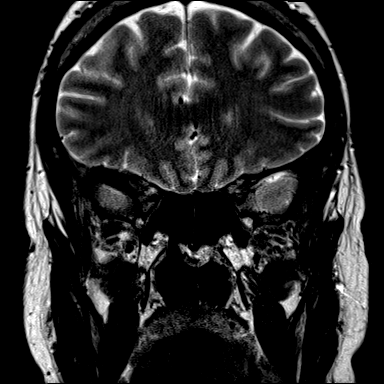

[Series 15: T1 · coronal · 3.0mm · 0.42mm/px · 1 of 10 slices shown (3 of 5)]
[im 1/10]
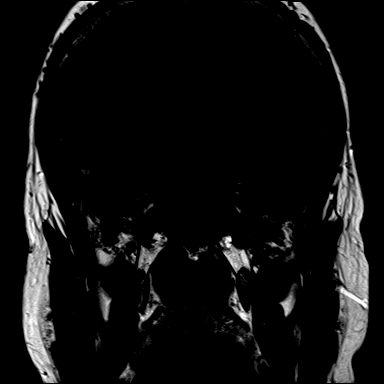

[Series 16: T1 · coronal · non-contrast · 3.0mm · 0.62mm/px · 1 of 9 slices shown (4 of 5)]
[im 1/9]
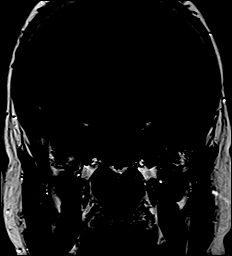

[Series 17: cor post dyn · coronal · 3.0mm · 0.62mm/px · 1 of 9 slices shown (1 of 6)]
[im 1/9]
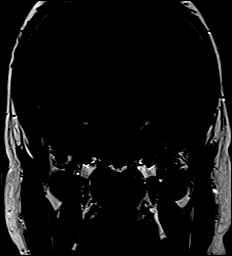

[Series 18: cor post dyn · coronal · 3.0mm · 0.62mm/px · 1 of 9 slices shown (2 of 6)]
[im 1/9]
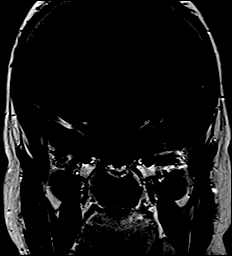

[Series 19: cor post dyn · coronal · 3.0mm · 0.62mm/px · 1 of 9 slices shown (3 of 6)]
[im 1/9]
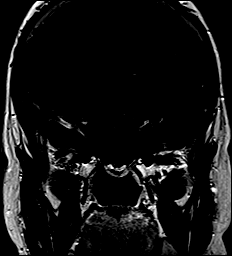

[Series 20: cor post dyn · coronal · 3.0mm · 0.62mm/px · 1 of 9 slices shown (4 of 6)]
[im 1/9]
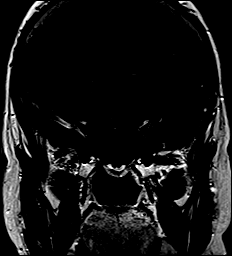

[Series 21: cor post dyn · coronal · 3.0mm · 0.62mm/px · 1 of 9 slices shown (5 of 6)]
[im 1/9]
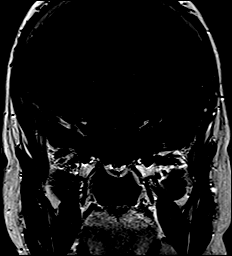

[Series 22: cor post dyn · coronal · 3.0mm · 0.62mm/px · 1 of 9 slices shown (6 of 6)]
[im 1/9]
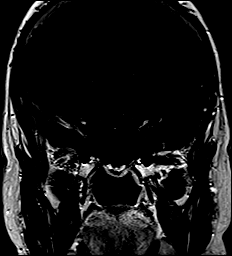

[Series 23: T1 post-contrast · sagittal · 3.0mm · 0.42mm/px · 1 of 13 slices shown (1 of 2)]
[im 1/13]
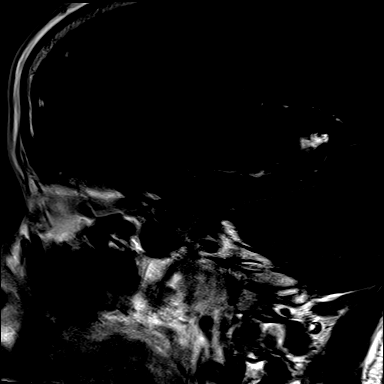

[Series 24: T1 post-contrast · coronal · 3.0mm · 0.42mm/px · 1 of 10 slices shown (2 of 2)]
[im 1/10]
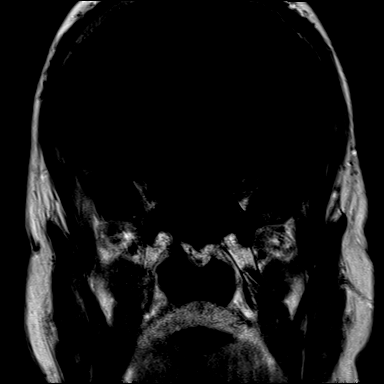

[Series 25: T1 · axial · 1.0mm · 0.86mm/px · z∈[-126,+14]mm · 8 of 144 slices shown (5 of 5)]
[im 1/144]
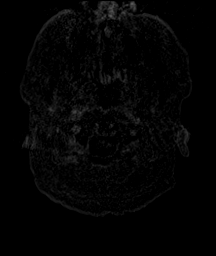
[im 21/144]
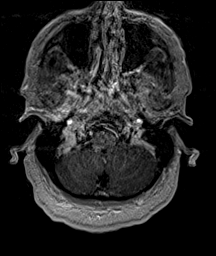
[im 41/144]
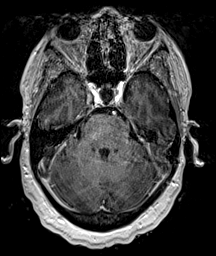
[im 62/144]
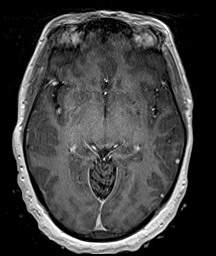
[im 82/144]
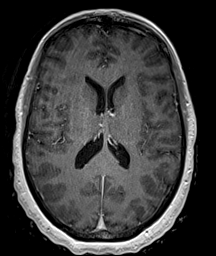
[im 103/144]
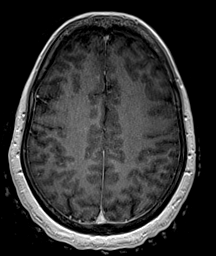
[im 123/144]
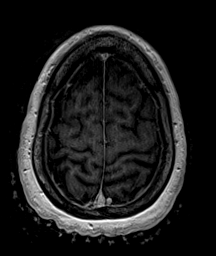
[im 144/144]
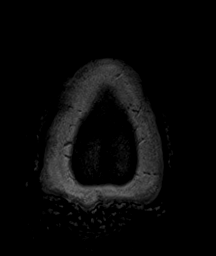

[48 of 48 positions shown; findings below may reference images not displayed]

FINDINGS: Brain: There is no evidence of an acute infarct, intracranial
hemorrhage, mass, midline shift, or extra-axial fluid collection.
The ventricles and sulci are normal. The brain is normal in signal,
and no abnormal enhancement is identified.

Dedicated imaging was performed through the sella turcica. There is
a moderately enlarged, partially empty sella with only a small
amount of pituitary tissue along the floor and walls of the sella
which enhances homogeneously. No sellar or suprasellar mass is
identified. The infundibulum is displaced into the posterior aspect
of the sella and remains midline. The optic chiasm and cavernous
sinuses are unremarkable.

Vascular: Major intracranial vascular flow voids are preserved.

Skull and upper cervical spine: Unremarkable bone marrow signal.

Sinuses/Orbits: Unremarkable orbits. Mild mucosal thickening in the
paranasal sinuses. Trace left mastoid fluid.

Other: None.
IMPRESSION: 1. Partially empty sella, often an incidental finding though can be
seen with idiopathic intracranial hypertension. No focal pituitary
lesion identified.
2. Otherwise unremarkable appearance of the brain.

## 2023-11-21 MED ORDER — ZEPBOUND 2.5 MG/0.5ML ~~LOC~~ SOAJ: 2.5000 mg | SUBCUTANEOUS | 0 refills | Status: DC

## 2023-11-22 LAB — LIPID PANEL
Chol/HDL Ratio: 5.5 ratio — ABNORMAL HIGH (ref 0.0–4.4)
Cholesterol, Total: 233 mg/dL — ABNORMAL HIGH (ref 100–199)
HDL: 42 mg/dL
LDL Chol Calc (NIH): 158 mg/dL — ABNORMAL HIGH (ref 0–99)
Triglycerides: 180 mg/dL — ABNORMAL HIGH (ref 0–149)
VLDL Cholesterol Cal: 33 mg/dL (ref 5–40)

## 2023-11-22 LAB — HEMOGLOBIN A1C
Est. average glucose Bld gHb Est-mCnc: 120 mg/dL
Hgb A1c MFr Bld: 5.8 % — ABNORMAL HIGH (ref 4.8–5.6)

## 2023-11-22 LAB — LIPOPROTEIN A (LPA): Lipoprotein (a): 9.6 nmol/L

## 2023-11-22 LAB — HEPATITIS B SURFACE ANTIBODY,QUALITATIVE

## 2023-12-03 ENCOUNTER — Ambulatory Visit: Payer: Self-pay | Admitting: Nurse Practitioner

## 2023-12-03 DIAGNOSIS — Z636 Dependent relative needing care at home: Secondary | ICD-10-CM | POA: Insufficient documentation

## 2023-12-03 NOTE — Assessment & Plan Note (Signed)
 Discussed weight management and medication options, emphasizing lifestyle changes. Reviewed insurance coverage for medications. - Update insurance information for medication coverage. - Consider prescribing Zepbound  for weight loss if covered by insurance. - Schedule follow-up in two months for weight check.

## 2023-12-03 NOTE — Assessment & Plan Note (Signed)
 Discussed increased cardiac risk, especially post-50. - Order lipoprotein(a) test to assess for genetic predisposition. - Consider referral to cardiologist if genetic predisposition is confirmed. - Discuss potential initiation of low-dose medication if genetic predisposition is confirmed.

## 2023-12-03 NOTE — Assessment & Plan Note (Signed)
 A1c stable at 5.4%, previously 5.9%. congratulated on improving this number - Recheck A1c to ensure stability.

## 2023-12-03 NOTE — Assessment & Plan Note (Signed)
 Significant stress from caregiving and financial strain. Discussed self-care and counseling benefits. - Designer, industrial/product for counseling services. - Emphasize importance of self-care and taking time for herself.

## 2023-12-05 ENCOUNTER — Other Ambulatory Visit: Payer: Self-pay | Admitting: Nurse Practitioner

## 2023-12-24 ENCOUNTER — Encounter (INDEPENDENT_AMBULATORY_CARE_PROVIDER_SITE_OTHER): Payer: Self-pay

## 2024-01-22 ENCOUNTER — Other Ambulatory Visit: Payer: Self-pay | Admitting: Nurse Practitioner

## 2024-01-22 DIAGNOSIS — E782 Mixed hyperlipidemia: Secondary | ICD-10-CM

## 2024-01-22 MED ORDER — ATORVASTATIN CALCIUM 20 MG PO TABS: 20.0000 mg | ORAL_TABLET | Freq: Every day | ORAL | 1 refills | Status: AC

## 2024-02-27 ENCOUNTER — Telehealth: Admitting: Nurse Practitioner

## 2024-02-27 ENCOUNTER — Other Ambulatory Visit

## 2024-02-27 ENCOUNTER — Encounter: Payer: Self-pay | Admitting: Nurse Practitioner

## 2024-02-27 DIAGNOSIS — E66812 Obesity, class 2: Secondary | ICD-10-CM

## 2024-02-27 DIAGNOSIS — K219 Gastro-esophageal reflux disease without esophagitis: Secondary | ICD-10-CM | POA: Diagnosis not present

## 2024-02-27 DIAGNOSIS — R030 Elevated blood-pressure reading, without diagnosis of hypertension: Secondary | ICD-10-CM | POA: Diagnosis not present

## 2024-02-27 DIAGNOSIS — E782 Mixed hyperlipidemia: Secondary | ICD-10-CM | POA: Insufficient documentation

## 2024-02-27 DIAGNOSIS — Z6835 Body mass index (BMI) 35.0-35.9, adult: Secondary | ICD-10-CM | POA: Diagnosis not present

## 2024-02-27 MED ORDER — OMEPRAZOLE 20 MG PO CPDR: 20.0000 mg | DELAYED_RELEASE_CAPSULE | Freq: Every day | ORAL | 1 refills | Status: AC

## 2024-02-27 MED ORDER — MUPIROCIN 2 % EX OINT: TOPICAL_OINTMENT | Freq: Two times a day (BID) | CUTANEOUS | 2 refills | Status: AC

## 2024-02-27 NOTE — Progress Notes (Signed)
 Virtual Visit via Video Note  LILLETTE Kristeen JINNY Gladis, CMA,acting as a scribe for Shelby Ada, FNP.,have documented all relevant documentation on the behalf of Shelby Ada, FNP,as directed by  Shelby Ada, FNP while in the presence of Shelby Ada, FNP.  I connected with Shelby Case on 02/27/2024 at  8:20 AM EST by a video enabled telemedicine application and verified that I am speaking with the correct person using two identifiers.  Patient Location: Other:  work [employer]  I discussed the limitations, risks, security, and privacy concerns of performing an evaluation and management service by video and the availability of in person appointments. I also discussed with the patient that there may be a patient responsible charge related to this service. The patient expressed understanding and agreed to proceed.  Subjective: PCP: Case Gaines, FNP  Chief Complaint  Patient presents with   Hyperlipidemia    Patient presents today for a chol follow up, Patient reports compliance with medication. Patient denies any chest pain, SOB, or headaches. Patient states when she takes the whole 20mg  of atorvastatin  she has bad heart burn she has started back taking half tablet. Patient states for the past 4 days her BP has been high.    Follow up with hyperlipidemia does report after the increase in her atorvastatin  she began having heartburn and has now cut the medication in half. At her last visit we checked a lipoprotein(a) which was normal.   Her blood pressure has been increased the last several days with one BP of 160/104, years ago she felt her blood pressure go up years ago but has not taken medications. She feels her stress is a little better. She has eaten more Rotel in the last few weeks. Since her blood pressure has been elevated she has increased her water intake.        ROS: Per HPI Current Medications[1]  Observations/Objective: There were no vitals filed  for this visit. Physical Exam Vitals and nursing note reviewed.  Constitutional:      General: She is not in acute distress.    Appearance: Normal appearance. She is obese.  Pulmonary:     Effort: Pulmonary effort is normal. No respiratory distress.  Skin:    Capillary Refill: Capillary refill takes less than 2 seconds.  Neurological:     General: No focal deficit present.     Mental Status: She is alert and oriented to person, place, and time.     Cranial Nerves: No cranial nerve deficit.  Psychiatric:        Mood and Affect: Mood normal.        Behavior: Behavior normal.        Thought Content: Thought content normal.        Judgment: Judgment normal.     Assessment and Plan: Mixed hyperlipidemia Assessment & Plan: She is to continue the atorvastatin  but will take the omeprazole  for one week then increase the dose of atorvastatin  back to ordered dose. Lipoprotein (a) is normal. Will recheck lipid panel today  Orders: -     Lipid panel; Future -     Comprehensive metabolic panel with GFR  Gastroesophageal reflux disease without esophagitis Assessment & Plan: Refilled her omeprazole  since the over the counter medications do not seem to be working.   Orders: -     Omeprazole ; Take 1 capsule (20 mg total) by mouth daily.  Dispense: 90 capsule; Refill: 1  Elevated blood-pressure reading without diagnosis of hypertension Assessment &  Plan: Reported elevated blood pressure over the last several days up to 160/104. Advised to limit intake of high salt foods and increase water intake. She is to take magnesium  glycinate over the counter. Will have her blood pressure checked today in office when comes for labs.    Class 2 severe obesity due to excess calories with serious comorbidity and body mass index (BMI) of 35.0 to 35.9 in adult Assessment & Plan: She is encouraged to strive for BMI less than 30 to decrease cardiac risk. Advised to aim for at least 150 minutes of exercise per  week.    Other orders -     Mupirocin ; Apply topically 2 (two) times daily.  Dispense: 22 g; Refill: 2    Follow Up Instructions: Return for keep same next.   I discussed the assessment and treatment plan with the patient. The patient was provided an opportunity to ask questions, and all were answered. The patient agreed with the plan and demonstrated an understanding of the instructions.   The patient was advised to call back or seek an in-person evaluation if the symptoms worsen or if the condition fails to improve as anticipated.  The above assessment and management plan was discussed with the patient. The patient verbalized understanding of and has agreed to the management plan.   LILLETTE Shelby Ada, FNP, have reviewed all documentation for this visit. The documentation on 02/27/2024 for the exam, diagnosis, procedures, and orders are all accurate and complete.      [1]  Current Outpatient Medications:    atorvastatin  (LIPITOR) 20 MG tablet, Take 1 tablet (20 mg total) by mouth daily., Disp: 90 tablet, Rfl: 1   hydrOXYzine  (ATARAX ) 10 MG tablet, Take 1 tablet (10 mg total) by mouth 3 (three) times daily as needed., Disp: 30 tablet, Rfl: 1   mometasone  (NASONEX ) 50 MCG/ACT nasal spray, Place 2 sprays into the nose daily., Disp: 1 each, Rfl: 2   mupirocin  ointment (BACTROBAN ) 2 %, Apply topically 2 (two) times daily., Disp: 22 g, Rfl: 2   omeprazole  (PRILOSEC) 20 MG capsule, Take 1 capsule (20 mg total) by mouth daily., Disp: 90 capsule, Rfl: 1

## 2024-02-27 NOTE — Assessment & Plan Note (Signed)
 She is to continue the atorvastatin  but will take the omeprazole  for one week then increase the dose of atorvastatin  back to ordered dose. Lipoprotein (a) is normal. Will recheck lipid panel today

## 2024-02-27 NOTE — Assessment & Plan Note (Signed)
 Reported elevated blood pressure over the last several days up to 160/104. Advised to limit intake of high salt foods and increase water intake. She is to take magnesium  glycinate over the counter. Will have her blood pressure checked today in office when comes for labs.

## 2024-02-27 NOTE — Patient Instructions (Addendum)
 You can take magnesium  glycinate daily in the evening to help with your blood pressure.  Limit salt intake Increase water to at least 64 oz a day.

## 2024-02-27 NOTE — Assessment & Plan Note (Signed)
 Refilled her omeprazole  since the over the counter medications do not seem to be working.

## 2024-02-27 NOTE — Assessment & Plan Note (Signed)
 She is encouraged to strive for BMI less than 30 to decrease cardiac risk. Advised to aim for at least 150 minutes of exercise per week.

## 2024-02-28 LAB — COMPREHENSIVE METABOLIC PANEL WITH GFR
ALT: 14 IU/L (ref 0–32)
AST: 16 IU/L (ref 0–40)
Albumin: 4.5 g/dL (ref 3.9–4.9)
Alkaline Phosphatase: 63 IU/L (ref 41–116)
BUN/Creatinine Ratio: 12 (ref 9–23)
BUN: 8 mg/dL (ref 6–24)
Bilirubin Total: 0.2 mg/dL (ref 0.0–1.2)
CO2: 22 mmol/L (ref 20–29)
Calcium: 9.6 mg/dL (ref 8.7–10.2)
Chloride: 105 mmol/L (ref 96–106)
Creatinine, Ser: 0.65 mg/dL (ref 0.57–1.00)
Globulin, Total: 2.4 g/dL (ref 1.5–4.5)
Glucose: 92 mg/dL (ref 70–99)
Potassium: 4.2 mmol/L (ref 3.5–5.2)
Sodium: 142 mmol/L (ref 134–144)
Total Protein: 6.9 g/dL (ref 6.0–8.5)
eGFR: 107 mL/min/1.73 (ref >59)

## 2024-03-04 ENCOUNTER — Institutional Professional Consult (permissible substitution): Admitting: Nurse Practitioner

## 2024-03-17 LAB — HM MAMMOGRAPHY

## 2024-03-19 ENCOUNTER — Ambulatory Visit
Admission: EM | Admit: 2024-03-19 | Discharge: 2024-03-19 | Disposition: A | Discharge status: Home / Self Care | Source: Home / Self Care

## 2024-03-19 DIAGNOSIS — R062 Wheezing: Secondary | ICD-10-CM

## 2024-03-19 DIAGNOSIS — J0110 Acute frontal sinusitis, unspecified: Secondary | ICD-10-CM | POA: Diagnosis not present

## 2024-03-19 MED ORDER — AZITHROMYCIN 250 MG PO TABS: 250.0000 mg | ORAL_TABLET | Freq: Every day | ORAL | 0 refills | Status: AC

## 2024-03-19 MED ORDER — FLUTICASONE PROPIONATE 50 MCG/ACT NA SUSP: 1.0000 | Freq: Every day | NASAL | 2 refills | Status: AC

## 2024-03-19 MED ORDER — PREDNISONE 10 MG (21) PO TBPK: ORAL_TABLET | Freq: Every day | ORAL | 0 refills | Status: AC

## 2024-03-19 NOTE — ED Triage Notes (Signed)
 Pt states congested in her head for the past 4 days.  States she has been taking catering manager and TheraFlu.

## 2024-03-19 NOTE — ED Provider Notes (Signed)
 " [facility]    CSN: [id number] Arrival date & time: 03/19/24  1123      History   Chief Complaint Chief Complaint  Patient presents with   Nasal Congestion    HPI Shelby Case is a 49 y.o. female.   Patient presents today with thick mucus sinus congestion for approximately 6 days.  Patient states that she has facial pressure and a headache.  Does have ear pain bilateral.  Does have a history of asthma has been using her inhaler with no relief.  Denies any chest pain.  Has been taking Alka-Seltzer with no relief.  States that she feels like she has had a fever but has not checked.    Past Medical History:  Diagnosis Date   Acute blood loss anemia 01/03/2013   Medical history non-contributory    Metrorrhagia 01/03/2013    Patient Active Problem List   Diagnosis Date Noted   Mixed hyperlipidemia 02/27/2024   Elevated blood-pressure reading without diagnosis of hypertension 02/27/2024   Caregiver stress 12/03/2023   Depression with anxiety 05/01/2023   COVID-19 vaccination declined 05/01/2023   Influenza vaccination declined 05/01/2023   Encounter for annual health examination 05/01/2023   Abnormal glucose 07/30/2022   Elevated cholesterol 07/30/2022   Hyperprolactinemia 07/30/2022   BMI 34.0-34.9,adult 07/30/2022   Constipation 04/24/2022   Gastroesophageal reflux disease 04/24/2022   Irritable bowel syndrome 04/24/2022   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 No pertinent surgical history.  OB History     Gravida  3   Para  1   Term      Preterm      AB      Living  1      SAB      IAB      Ectopic      Multiple      Live Births               Home Medications    Prior to Admission  medications  Medication Sig Start Date End Date Taking? Authorizing Provider  azithromycin (ZITHROMAX Z-PAK) 250 MG tablet Take 1 tablet (250 mg total) by mouth daily. 03/19/24  Yes Merilee Andrea CROME, NP  fluticasone (FLONASE) 50 MCG/ACT nasal spray Place 1 spray into both nostrils daily. 03/19/24  Yes Merilee Andrea CROME, NP  predniSONE (STERAPRED UNI-PAK 21 TAB) 10 MG (21) TBPK tablet Take by mouth daily. Take 6 tabs by mouth daily  for 2 days, then 5 tabs for 2 days, then 4 tabs for 2 days, then 3 tabs for 2 days, 2 tabs for 2 days, then 1 tab by mouth daily for 2 days 03/19/24  Yes Merilee Andrea CROME, NP  atorvastatin  (LIPITOR) 20 MG tablet Take 1 tablet (20 mg total) by mouth daily. 01/22/24 01/21/25  Moore, Janece, FNP  hydrOXYzine  (ATARAX ) 10 MG tablet Take 1 tablet (10 mg total) by mouth 3 (three) times daily as needed. 05/01/23   Georgina Speaks, FNP  mupirocin  ointment (BACTROBAN ) 2 % Apply topically 2 (two) times daily. 02/27/24   Georgina Speaks, FNP  omeprazole  (PRILOSEC) 20 MG capsule Take 1 capsule (20 mg total) by mouth daily. 02/27/24   Georgina Speaks, FNP    Family History Family History  Problem Relation Age of Onset   Irregular heart beat Father    Hypertension Brother     Social History Social History[1]   Allergies   Penicillins   Review of Systems Review of Systems  Constitutional:  Negative for fever.  HENT:  Positive for congestion, ear pain, postnasal drip, rhinorrhea, sinus pressure and sinus pain. Negative for ear discharge, facial swelling and sore throat.   Respiratory:  Positive for wheezing. Negative for cough and shortness of breath.   Cardiovascular: Negative.   Gastrointestinal: Negative.   Genitourinary: Negative.   Musculoskeletal: Negative.   Neurological:  Positive for headaches.       Intermittent headaches none at this time     Physical Exam Triage Vital Signs ED Triage Vitals  Encounter Vitals Group     BP 03/19/24 1150 137/83     Girls  Systolic BP Percentile --      Girls Diastolic BP Percentile --      Boys Systolic BP Percentile --      Boys Diastolic BP Percentile --      Pulse Rate 03/19/24 1150 95     Resp 03/19/24 1150 16     Temp 03/19/24 1150 98.5 F (36.9 C)     Temp Source 03/19/24 1150 Oral     SpO2 03/19/24 1150 95 %     Weight --      Height --      Head Circumference --      Peak Flow --      Pain Score 03/19/24 1149 0     Pain Loc --      Pain Education --      Exclude from Growth Chart --    No data found.  Updated Vital Signs BP 137/83 (BP Location: Left Arm)   Pulse 95   Temp 98.5 F (36.9 C) (Oral)   Resp 16   LMP 09/17/2023   SpO2 95%   Visual Acuity Right Eye Distance:   Left Eye Distance:   Bilateral Distance:    Right Eye Near:   Left Eye Near:    Bilateral Near:     Physical Exam Constitutional:      Appearance: She is ill-appearing.  HENT:     Right Ear: Tympanic membrane normal.     Left Ear: Tympanic membrane normal.     Nose: Congestion and rhinorrhea present.     Mouth/Throat:     Mouth: Mucous membranes are moist.     Pharynx: No oropharyngeal exudate or posterior oropharyngeal erythema.  Eyes:     Pupils: Pupils are equal, round, and reactive to light.  Cardiovascular:     Rate and Rhythm: Normal rate.     Pulses: Normal pulses.     Heart sounds: Normal heart sounds.  Pulmonary:     Effort: Pulmonary effort is normal.     Breath sounds: Wheezing present.  Abdominal:     General: Abdomen is flat.  Musculoskeletal:     Cervical back: Normal range of motion.  Skin:    General: Skin is warm.  Neurological:     General: No focal deficit present.     Mental Status: She is alert.      UC Treatments / Results  Labs (all labs ordered are listed, but only abnormal results are displayed) Labs Reviewed - No data to display  EKG   Radiology No results found.  Procedures Procedures (including critical care time)  Medications Ordered in  UC Medications -  No data to display  Initial Impression / Assessment and Plan / UC Course  I have reviewed the triage vital signs and the nursing notes.  Pertinent labs & imaging results that were available during my care of the patient were reviewed by me and considered in my medical decision making (see chart for details).     Started on antibiotics take with food Use nasal spray as needed Will start on prednisone for wheezing use inhaler as needed Use a humidifier nighttime this will help open your airway Take Tylenol  or Motrin  as needed for pain or fever Stay hydrated drink plenty of fluids Final Clinical Impressions(s) / UC Diagnoses   Final diagnoses:  Acute non-recurrent frontal sinusitis  Wheezing     Discharge Instructions      Started on antibiotics take with food Use nasal spray as needed Will start on prednisone for wheezing use inhaler as needed Use a humidifier nighttime this will help open your airway Take Tylenol  or Motrin  as needed for pain or fever Stay hydrated drink plenty of fluids     ED Prescriptions     Medication Sig Dispense Auth. Provider   azithromycin (ZITHROMAX Z-PAK) 250 MG tablet Take 1 tablet (250 mg total) by mouth daily. 6 tablet Merilee Hollering L, NP   predniSONE (STERAPRED UNI-PAK 21 TAB) 10 MG (21) TBPK tablet Take by mouth daily. Take 6 tabs by mouth daily  for 2 days, then 5 tabs for 2 days, then 4 tabs for 2 days, then 3 tabs for 2 days, 2 tabs for 2 days, then 1 tab by mouth daily for 2 days 42 tablet Merilee Hollering L, NP   fluticasone (FLONASE) 50 MCG/ACT nasal spray Place 1 spray into both nostrils daily. 16 g Merilee Hollering CROME, NP      PDMP not reviewed this encounter.    [1]  Social History Tobacco Use   Smoking status: Light Smoker    Current packs/day: 0.00    Types: Cigarettes    Last attempt to quit: 12/27/2012    Years since quitting: 11.2   Smokeless tobacco: Never   Tobacco comments:     occassionally will smoke Black and Mild - not as much as before; 05/01/2023 - 1-2 cigars per day.   Substance Use Topics   Alcohol use: No   Drug use: No     Merilee Hollering CROME, NP 03/19/24 1215  "

## 2024-03-19 NOTE — Discharge Instructions (Addendum)
 Started on antibiotics take with food Use nasal spray as needed Will start on prednisone for wheezing use inhaler as needed Use a humidifier nighttime this will help open your airway Take Tylenol  or Motrin  as needed for pain or fever Stay hydrated drink plenty of fluids

## 2024-03-24 ENCOUNTER — Ambulatory Visit: Payer: Self-pay | Admitting: Nurse Practitioner

## 2024-05-04 ENCOUNTER — Encounter: Payer: Commercial Managed Care - PPO | Admitting: Nurse Practitioner
